# Patient Record
Sex: Male | Born: 1950 | Race: White | Hispanic: No
Health system: Southern US, Community
[De-identification: ages and names within clinical notes are randomized; demographics above are authoritative.]

---

## 2000-12-29 ENCOUNTER — Inpatient Hospital Stay (HOSPITAL_COMMUNITY): Admission: AD | Admit: 2000-12-29 | Discharge: 2000-12-30 | Payer: Self-pay | Admitting: Cardiovascular Disease

## 2003-03-21 ENCOUNTER — Emergency Department (HOSPITAL_COMMUNITY): Admission: EM | Admit: 2003-03-21 | Discharge: 2003-03-21 | Payer: Self-pay | Admitting: Emergency Medicine

## 2003-03-21 ENCOUNTER — Encounter: Payer: Self-pay | Admitting: Emergency Medicine

## 2003-03-22 ENCOUNTER — Encounter: Payer: Self-pay | Admitting: Urology

## 2003-03-22 ENCOUNTER — Ambulatory Visit (HOSPITAL_COMMUNITY): Admission: RE | Admit: 2003-03-22 | Discharge: 2003-03-22 | Payer: Self-pay | Admitting: Urology

## 2003-04-04 ENCOUNTER — Ambulatory Visit (HOSPITAL_COMMUNITY): Admission: RE | Admit: 2003-04-04 | Discharge: 2003-04-04 | Payer: Self-pay | Admitting: Urology

## 2003-04-04 ENCOUNTER — Encounter: Payer: Self-pay | Admitting: Urology

## 2003-04-05 ENCOUNTER — Ambulatory Visit (HOSPITAL_COMMUNITY): Admission: RE | Admit: 2003-04-05 | Discharge: 2003-04-05 | Payer: Self-pay | Admitting: Urology

## 2003-04-05 ENCOUNTER — Encounter: Payer: Self-pay | Admitting: Urology

## 2003-06-25 ENCOUNTER — Ambulatory Visit (HOSPITAL_COMMUNITY): Admission: RE | Admit: 2003-06-25 | Discharge: 2003-06-25 | Payer: Self-pay | Admitting: Urology

## 2003-06-25 ENCOUNTER — Encounter: Payer: Self-pay | Admitting: Urology

## 2003-09-03 ENCOUNTER — Ambulatory Visit (HOSPITAL_COMMUNITY): Admission: RE | Admit: 2003-09-03 | Discharge: 2003-09-03 | Payer: Self-pay | Admitting: Urology

## 2003-09-05 ENCOUNTER — Ambulatory Visit (HOSPITAL_COMMUNITY): Admission: RE | Admit: 2003-09-05 | Discharge: 2003-09-05 | Payer: Self-pay | Admitting: Urology

## 2003-09-06 ENCOUNTER — Ambulatory Visit (HOSPITAL_COMMUNITY): Admission: RE | Admit: 2003-09-06 | Discharge: 2003-09-06 | Payer: Self-pay | Admitting: Urology

## 2003-09-19 ENCOUNTER — Ambulatory Visit (HOSPITAL_COMMUNITY): Admission: RE | Admit: 2003-09-19 | Discharge: 2003-09-19 | Payer: Self-pay | Admitting: Urology

## 2003-09-24 ENCOUNTER — Ambulatory Visit (HOSPITAL_COMMUNITY): Admission: RE | Admit: 2003-09-24 | Discharge: 2003-09-24 | Payer: Self-pay | Admitting: Urology

## 2003-11-09 ENCOUNTER — Ambulatory Visit (HOSPITAL_COMMUNITY): Admission: RE | Admit: 2003-11-09 | Discharge: 2003-11-09 | Payer: Self-pay | Admitting: Urology

## 2004-04-14 ENCOUNTER — Ambulatory Visit (HOSPITAL_COMMUNITY): Admission: RE | Admit: 2004-04-14 | Discharge: 2004-04-14 | Payer: Self-pay | Admitting: *Deleted

## 2006-01-04 ENCOUNTER — Encounter: Admission: RE | Admit: 2006-01-04 | Discharge: 2006-01-04 | Payer: Self-pay | Admitting: Orthopedic Surgery

## 2006-01-05 ENCOUNTER — Encounter: Admission: RE | Admit: 2006-01-05 | Discharge: 2006-01-05 | Payer: Self-pay | Admitting: Orthopedic Surgery

## 2006-01-11 ENCOUNTER — Ambulatory Visit (HOSPITAL_COMMUNITY): Admission: RE | Admit: 2006-01-11 | Discharge: 2006-01-11 | Payer: Self-pay | Admitting: Urology

## 2006-01-22 ENCOUNTER — Encounter: Admission: RE | Admit: 2006-01-22 | Discharge: 2006-01-22 | Payer: Self-pay | Admitting: Orthopedic Surgery

## 2006-12-01 ENCOUNTER — Ambulatory Visit (HOSPITAL_COMMUNITY): Admission: RE | Admit: 2006-12-01 | Discharge: 2006-12-01 | Payer: Self-pay | Admitting: Urology

## 2007-06-03 ENCOUNTER — Ambulatory Visit (HOSPITAL_COMMUNITY): Admission: RE | Admit: 2007-06-03 | Discharge: 2007-06-03 | Payer: Self-pay | Admitting: Urology

## 2009-05-03 ENCOUNTER — Ambulatory Visit: Payer: Self-pay | Admitting: Surgery

## 2009-05-03 ENCOUNTER — Encounter (INDEPENDENT_AMBULATORY_CARE_PROVIDER_SITE_OTHER): Payer: Self-pay | Admitting: Emergency Medicine

## 2009-05-03 ENCOUNTER — Emergency Department (HOSPITAL_COMMUNITY): Admission: EM | Admit: 2009-05-03 | Discharge: 2009-05-03 | Payer: Self-pay | Admitting: Emergency Medicine

## 2009-09-24 ENCOUNTER — Ambulatory Visit: Payer: Self-pay | Admitting: Cardiothoracic Surgery

## 2009-09-24 ENCOUNTER — Inpatient Hospital Stay (HOSPITAL_COMMUNITY): Admission: EM | Admit: 2009-09-24 | Discharge: 2009-10-03 | Payer: Self-pay | Admitting: Emergency Medicine

## 2009-09-25 ENCOUNTER — Encounter: Payer: Self-pay | Admitting: Cardiothoracic Surgery

## 2009-10-21 ENCOUNTER — Encounter: Admission: RE | Admit: 2009-10-21 | Discharge: 2009-10-21 | Payer: Self-pay | Admitting: Cardiothoracic Surgery

## 2009-10-21 ENCOUNTER — Ambulatory Visit: Payer: Self-pay | Admitting: Cardiothoracic Surgery

## 2009-11-04 ENCOUNTER — Encounter (HOSPITAL_COMMUNITY): Admission: RE | Admit: 2009-11-04 | Discharge: 2010-01-23 | Payer: Self-pay | Admitting: Cardiology

## 2009-11-16 ENCOUNTER — Inpatient Hospital Stay (HOSPITAL_COMMUNITY): Admission: EM | Admit: 2009-11-16 | Discharge: 2009-11-20 | Payer: Self-pay | Admitting: Internal Medicine

## 2009-11-16 ENCOUNTER — Encounter (INDEPENDENT_AMBULATORY_CARE_PROVIDER_SITE_OTHER): Payer: Self-pay | Admitting: Internal Medicine

## 2009-11-16 ENCOUNTER — Ambulatory Visit: Payer: Self-pay | Admitting: Gastroenterology

## 2009-11-21 ENCOUNTER — Ambulatory Visit: Payer: Self-pay | Admitting: Gastroenterology

## 2009-11-21 ENCOUNTER — Telehealth: Payer: Self-pay | Admitting: Gastroenterology

## 2009-11-21 LAB — CONVERTED CEMR LAB
Eosinophils Relative: 4.7 % (ref 0.0–5.0)
HCT: 31.6 % — ABNORMAL LOW (ref 39.0–52.0)
Hemoglobin: 10.2 g/dL — ABNORMAL LOW (ref 13.0–17.0)
Lymphs Abs: 1.8 10*3/uL (ref 0.7–4.0)
MCV: 91.7 fL (ref 78.0–100.0)
Monocytes Relative: 11.6 % (ref 3.0–12.0)
Neutro Abs: 3.6 10*3/uL (ref 1.4–7.7)
WBC: 6.6 10*3/uL (ref 4.5–10.5)

## 2009-12-02 ENCOUNTER — Ambulatory Visit: Payer: Self-pay | Admitting: Gastroenterology

## 2009-12-02 DIAGNOSIS — I82409 Acute embolism and thrombosis of unspecified deep veins of unspecified lower extremity: Secondary | ICD-10-CM | POA: Insufficient documentation

## 2009-12-02 DIAGNOSIS — K253 Acute gastric ulcer without hemorrhage or perforation: Secondary | ICD-10-CM

## 2009-12-02 DIAGNOSIS — I2510 Atherosclerotic heart disease of native coronary artery without angina pectoris: Secondary | ICD-10-CM | POA: Insufficient documentation

## 2009-12-02 LAB — CONVERTED CEMR LAB
Basophils Absolute: 0.1 10*3/uL (ref 0.0–0.1)
Eosinophils Absolute: 0.2 10*3/uL (ref 0.0–0.7)
HCT: 30.1 % — ABNORMAL LOW (ref 39.0–52.0)
Lymphs Abs: 2.2 10*3/uL (ref 0.7–4.0)
MCHC: 34 g/dL (ref 30.0–36.0)
MCV: 94.1 fL (ref 78.0–100.0)
Monocytes Absolute: 0.8 10*3/uL (ref 0.1–1.0)
Platelets: 247 10*3/uL (ref 150.0–400.0)
RDW: 14.4 % (ref 11.5–14.6)

## 2010-01-22 ENCOUNTER — Ambulatory Visit: Payer: Self-pay | Admitting: Cardiothoracic Surgery

## 2010-07-12 IMAGING — CR DG CHEST 2V
1 series · 1 of 1 positions shown · non-contrast
Comparison: Chest radiograph performed 10/21/2009

CLINICAL DATA: Melena.

CHEST - 2 VIEW

[view not recorded]
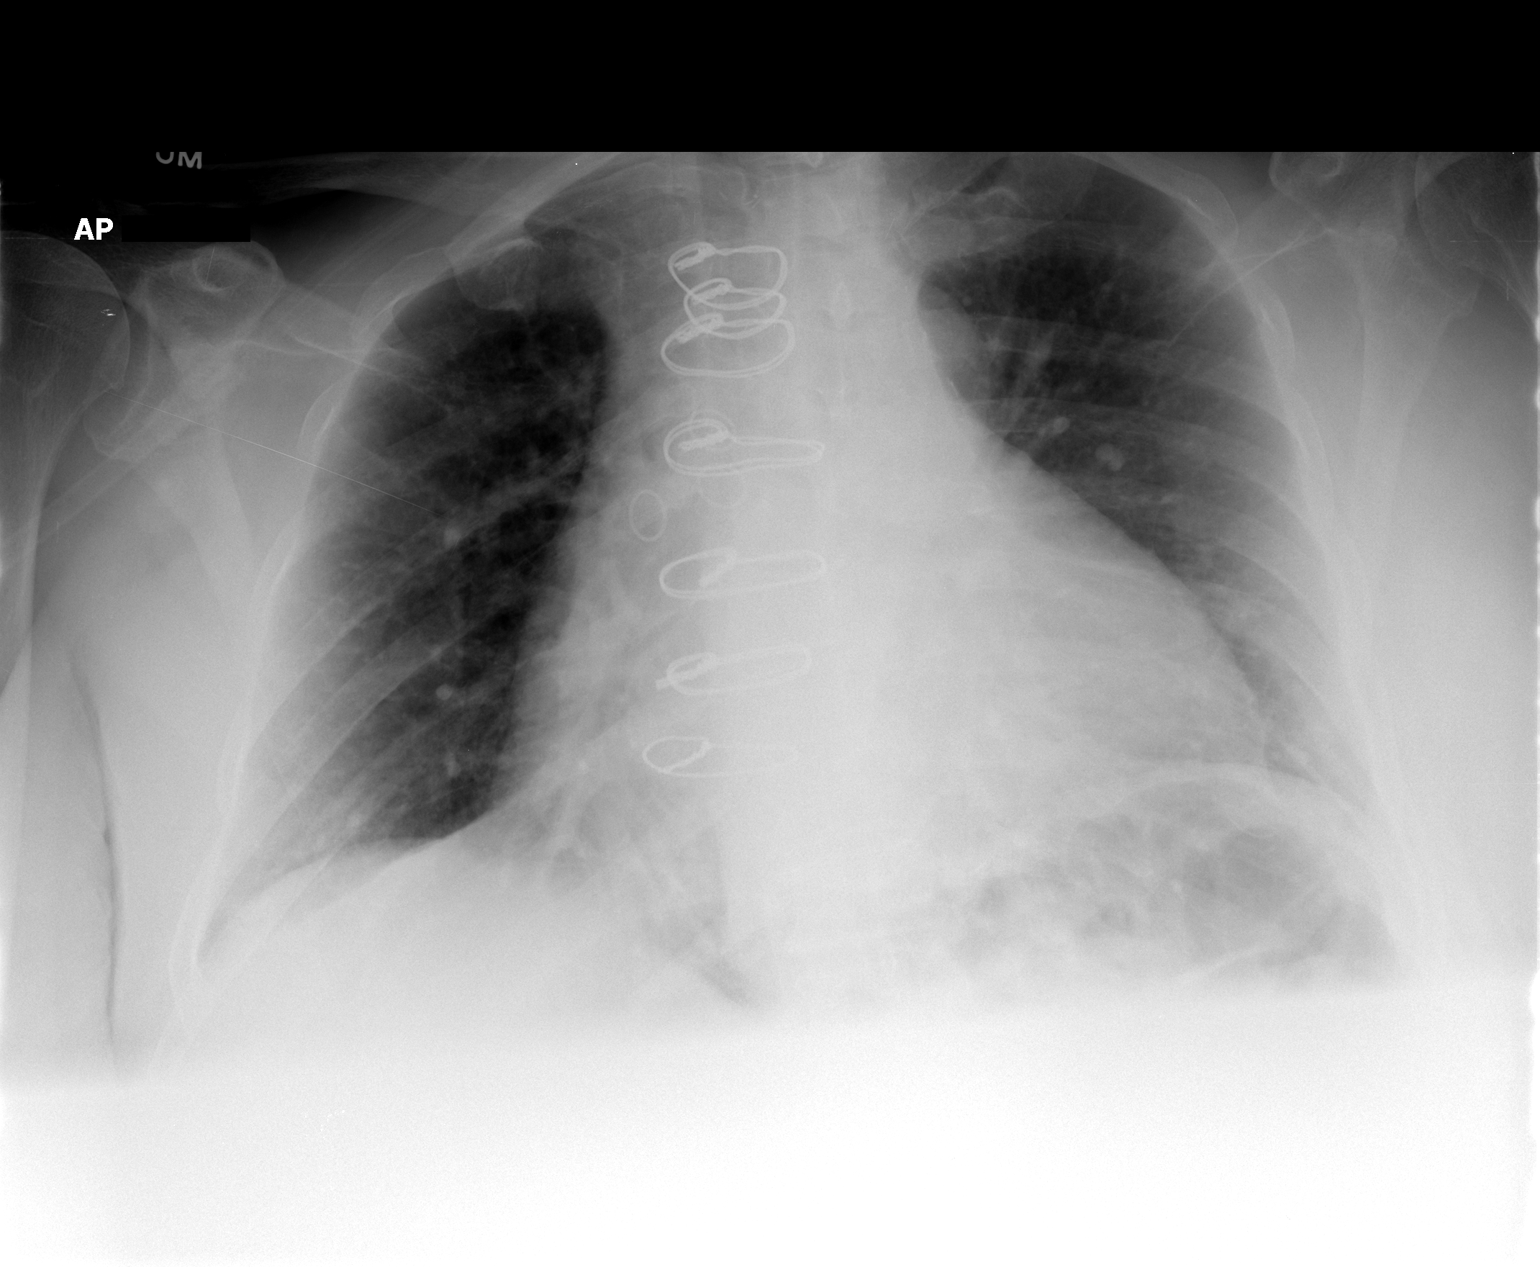

[1 of 1 positions shown; findings below may reference images not displayed]

FINDINGS: The lungs are mildly hypoexpanded, with mild left basilar
atelectasis.  Of the patient's small left pleural effusion appears
have largely resolved. There is no evidence of pneumothorax.

The heart is enlarged, more prominent than on the prior study; the
the patient is status post median sternotomy, with evidence of
prior CABG.  No acute osseous abnormalities are seen.
IMPRESSION: 1.  Mildly hypoexpanded lungs, with mild left basilar atelectasis.
Previously noted small left pleural effusion has largely resolved.
2.  Mild cardiomegaly noted, new from the prior study.

## 2010-11-25 NOTE — Letter (Signed)
Summary: New Patient letter  [facility]  [address] [place], [place] [address]   Phone: [phone]  Fax: [phone]       11/21/2009 MRN: [id number]  [facility] [address] [place], [place]  [address]  Dear Mr. Qadir,  Welcome to the Gastroenterology Division at [facility].    You are scheduled to see Dr. Melvia Heaps on 12-02-09 at 2:30pm, on the 3rd floor at [facility], [address].  We ask that you try to arrive at our office 15 minutes prior to your appointment time to allow for check-in.  We would like you to complete the enclosed self-administered evaluation form prior to your visit and bring it with you on the day of your appointment.  We will review it with you.  Also, please bring a complete list of all your medications or, if you prefer, bring the medication bottles and we will list them.  Please bring your insurance card so that we may make a copy of it.  If your insurance requires a referral to see a specialist, please bring your referral form from your primary care physician.  Co-payments are due at the time of your visit and may be paid by cash, check or credit card.     Your office visit will consist of a consult with your physician (includes a physical exam), any laboratory testing he/she may order, scheduling of any necessary diagnostic testing (e.g. x-ray, ultrasound, CT-scan), and scheduling of a procedure (e.g. Endoscopy, Colonoscopy) if required.  Please allow enough time on your schedule to allow for any/all of these possibilities.    If you cannot keep your appointment, please call [phone] to cancel or reschedule prior to your appointment date.  This allows [place] the opportunity to schedule an appointment for another patient in need of care.  If you do not cancel or reschedule by 5 p.m. the business day prior to your appointment date, you will be charged a $50.00 late cancellation/no-show fee.    Thank you for  choosing [facility] for your medical needs.  We appreciate the opportunity to care for you.  Please visit [place] at our website  to learn more about our practice.                     Sincerely,                                                             The Gastroenterology Division   Appended Document: New Patient letter Letter mailed to patient.

## 2010-11-25 NOTE — Procedures (Signed)
Summary: Upper Endoscopy  Patient: Gary [name] Note: All result statuses are Final unless otherwise noted.  Tests: (1) Upper Endoscopy (EGD)   EGD Upper Endoscopy       DONE     [facility]     [address]     [place], [place]  [address]           ENDOSCOPY PROCEDURE REPORT           PATIENT:  Gary [name], Gary [name]  MR#:  [id number]     BIRTHDATE:  05/19/1951, 58 yrs. old  GENDER:  male           ENDOSCOPIST:  Barbette Hair. Arlyce Dice, MD     Referred by:           PROCEDURE DATE:  11/16/2009     PROCEDURE:  EGD with biopsy     ASA CLASS:  Class II     INDICATIONS:  melena           MEDICATIONS:   Fentanyl 70 mcg IV, Versed 7 mg IV, glycopyrrolate     (Robinal) 0.2 mg IV     TOPICAL ANESTHETIC:  Cetacaine Spray           DESCRIPTION OF PROCEDURE:   After the risks benefits and     alternatives of the procedure were thoroughly explained, informed     consent was obtained.  The EG-[id number] ([id number]) endoscope was     introduced through the mouth and advanced to the third portion of     the duodenum, without limitations.  The instrument was slowly     withdrawn as the mucosa was fully examined.     <<PROCEDUREIMAGES>>           Multiple ulcers were found in the antrum. Multiple linear     superficial linear ulcers, some with red spots at base (see     image005 and image004). Bx taken  irregular Z-line at the     gastroesophageal junction (see image001). Bxs taken (h/o     Barrett's)  Otherwise the examination was normal.    Retroflexed     views revealed no abnormalities.    The scope was then withdrawn     from the patient and the procedure completed.           COMPLICATIONS:  None           ENDOSCOPIC IMPRESSION:     1) Ulcers, multiple in the antrum     2) Irregular Z-line at the gastroesophageal junction     3) Otherwise normal examination     RECOMMENDATIONS:     1) continue PPI - protonix BID     2) await biopsy results           REPEAT EXAM:  No       ______________________________     Barbette Hair. Arlyce Dice, MD           CC:           n.     eSIGNED:   Barbette Hair. Kaplan at 11/16/2009 04:35 PM           Gary [name], [id number]  Note: An exclamation mark (!) indicates a result that was not dispersed into the flowsheet. Document Creation Date: 11/16/2009 4:36 PM _______________________________________________________________________  (1) Order result status: Final Collection or observation date-time: 11/16/2009 16:32 Requested date-time:  Receipt date-time:  Reported date-time:  Referring Physician:   Ordering Physician: Melvia Heaps (  [id number]) Specimen Source:  Source: Launa Grill Order Number: [id number] Lab site:

## 2010-11-25 NOTE — Progress Notes (Signed)
Summary: TRIAGE-Black Stools  Phone Note Call from Patient Call back at Home Phone [phone]   Call For: Dr Arlyce Dice Reason for Call: Talk to Nurse Summary of Call: Had an Endo. on 11-16-09 and is having black stools today-wonders if this is normal. Initial call taken by: Leanor Kail [facility],  November 21, 2009 8:26 AM  Follow-up for Phone Call        Pt. was discharged from [facility] on 11-20-09, had an Endo. 11-16-09.  Pt. hasn't had a BM since Saturday, had a BM this morning, it was black and tarry. Denies BRB, pain, fever, n/v, fatigue, weakness.   [facility] PLEASE ADVISE  Follow-up by: Laureen Ochs LPN,  November 21, 2009 8:59 AM  Additional Follow-up for Phone Call Additional follow up Details #1::        Could be old blood.  Needs STAT CBC; keep NPO until we see results Additional Follow-up by: Louis Meckel MD,  November 21, 2009 9:19 AM    Additional Follow-up for Phone Call Additional follow up Details #2::    Above MD orders reviewed with patient. He will come to the lab now. Laureen Ochs LPN  November 21, 2009 9:28 AM   Per Dr.Kaplan-Blood count is stable, probably old blood, pt. notified. Pt. will f/u w/Dr.Kaplan on 12-02-09 at 2:30pm, he will get a CBCD prior. Pt. to callback right away for any continued black stools, n/v,fever,pain, etc.  Follow-up by: Laureen Ochs LPN,  November 21, 2009 11:30 AM

## 2010-11-25 NOTE — Assessment & Plan Note (Signed)
Summary: POST HOSPITAL/ULCERS               DEBORAH   History of Present Illness Visit Type: Follow-up Visit Primary GI MD: Melvia Heaps MD [facility] Primary Provider: Tonna Corner, MD  Requesting Provider: n/a Chief Complaint: F/u from hospital for Ulcers History of Present Illness:   Mr. Helmers has returned following hospitalization for an acute GI bleed secondary to gastric ulcer.  He was on aspirin and Plavix following an acute MI in December, 2010.  These medicines have been resumed.  He has no GI complaints.  Biopsies of both his ulcer and of an irregular GE junction demonstrated inflammatory changes only.   GI Review of Systems      Denies abdominal pain, acid reflux, belching, bloating, chest pain, dysphagia with liquids, dysphagia with solids, heartburn, loss of appetite, nausea, vomiting, vomiting blood, weight loss, and  weight gain.        Denies anal fissure, black tarry stools, change in bowel habit, constipation, diarrhea, diverticulosis, fecal incontinence, heme positive stool, hemorrhoids, irritable bowel syndrome, jaundice, light color stool, liver problems, rectal bleeding, and  rectal pain.    Current Medications (verified): 1)  Protonix 40 Mg Tbec (Pantoprazole Sodium) .Marland Kitchen.. 1 By Mouth Once Daily 2)  Plavix 75 Mg Tabs (Clopidogrel Bisulfate) .Marland Kitchen.. 1 By Mouth Once Daily 3)  Oxycodone Hcl 5 Mg Tabs (Oxycodone Hcl) .... As Needed 4)  Amiodarone Hcl 200 Mg Tabs (Amiodarone Hcl) .Marland Kitchen.. 1 By Mouth Once Daily 5)  Diazepam 5 Mg Tabs (Diazepam) .Marland Kitchen.. 1 By Mouth As Needed 6)  Folic Acid 1 Mg Tabs (Folic Acid) .Marland Kitchen.. 1 By Mouth Once Daily 7)  Metoprolol Tartrate 25 Mg Tabs (Metoprolol Tartrate) .Marland Kitchen.. 1 By Mouth Two Times A Day 8)  Potassium Citrate 10 Meq (1080 Mg) Cr-Tabs (Potassium Citrate) .Marland Kitchen.. 1 By Mouth Three Times A Day 9)  Ramipril 2.5 Mg Caps (Ramipril) .Marland Kitchen.. 1 By Mouth Once Daily 10)  Crestor 20 Mg Tabs (Rosuvastatin Calcium) .Marland Kitchen.. 1 By Mouth Once Daily 11)  Tramadol Hcl 50  Mg Tabs (Tramadol Hcl) .Marland Kitchen.. 1 By Mouth Three Times A Day As Needed 12)  Tylenol Arthritis Pain 650 Mg Cr-Tabs (Acetaminophen) .Marland Kitchen.. 1 By Mouth Once Daily As Needed  Allergies (verified): 1)  ! Sulfa  Past History:  Past Medical History: CAD Anemia Hypertension gastric antral  ulcer DVT Hepatitis B GERD Chronic Kidney Disease Barretts Esophagus Anxiety Disorder Arthritis  Past Surgical History: Heart Bypass  Anti-GERD surgery   Family History: Family History of Diabetes: Multiple Family Members Family History of Colon Cancer:Maternal Aunt and Maternal Uncle  Social History: Occupation: Tech Divorced 2 childern Patient is a former smoker. : quit 45 years ago  Alcohol Use - no Illicit Drug Use - no Smoking Status:  quit Drug Use:  no  Review of Systems       The patient complains of anxiety-new, arthritis/joint pain, depression-new, fatigue, night sweats, shortness of breath, and urination - excessive.  The patient denies allergy/sinus, anemia, back pain, blood in urine, breast changes/lumps, change in vision, confusion, cough, coughing up blood, fainting, fever, headaches-new, hearing problems, heart murmur, heart rhythm changes, itching, muscle pains/cramps, nosebleeds, skin rash, sleeping problems, sore throat, swelling of feet/legs, swollen lymph glands, thirst - excessive, urination changes/pain, urine leakage, vision changes, and voice change.    Vital Signs:  Patient profile:   60 year old male Height:      71 inches Weight:      228 pounds BMI:  31.91 BSA:     2.23 Pulse rate:   64 / minute Pulse rhythm:   regular BP sitting:   126 / 84  (left arm) Cuff size:   regular  Vitals Entered By: Ok Anis CMA (December 02, 2009 2:54 PM)   Impression & Recommendations:  Problem # 1:  ACUT GASTR ULCER W/O MENTION HEMORR/PERF W/OBST (ICD-531.31) Plan to continue Protonix for the next 3 months.  It is okay to continue aspirin and Plavix, in the absence of  overt  bleeding.  Problem # 2:  CAD (ICD-414.00) Assessment: Comment Only  Problem # 3:  Hx of DVT (ICD-453.40) Assessment: Comment Only  Patient Instructions: 1)  CC Dr. Warrick Parisian 2)  CC Dr. Julieanne Manson

## 2011-01-11 LAB — CBC
HCT: 24.3 % — ABNORMAL LOW (ref 39.0–52.0)
HCT: 24.9 % — ABNORMAL LOW (ref 39.0–52.0)
HCT: 24.9 % — ABNORMAL LOW (ref 39.0–52.0)
HCT: 27 % — ABNORMAL LOW (ref 39.0–52.0)
Hemoglobin: 7.6 g/dL — ABNORMAL LOW (ref 13.0–17.0)
Hemoglobin: 7.8 g/dL — ABNORMAL LOW (ref 13.0–17.0)
Hemoglobin: 8.4 g/dL — ABNORMAL LOW (ref 13.0–17.0)
Hemoglobin: 8.6 g/dL — ABNORMAL LOW (ref 13.0–17.0)
Hemoglobin: 9.4 g/dL — ABNORMAL LOW (ref 13.0–17.0)
MCHC: 33.8 g/dL (ref 30.0–36.0)
MCHC: 34.4 g/dL (ref 30.0–36.0)
MCHC: 34.4 g/dL (ref 30.0–36.0)
MCHC: 34.4 g/dL (ref 30.0–36.0)
MCHC: 34.8 g/dL (ref 30.0–36.0)
MCV: 89.9 fL (ref 78.0–100.0)
MCV: 90.3 fL (ref 78.0–100.0)
MCV: 91.6 fL (ref 78.0–100.0)
MCV: 92.6 fL (ref 78.0–100.0)
Platelets: 168 10*3/uL (ref 150–400)
Platelets: 177 10*3/uL (ref 150–400)
Platelets: 182 10*3/uL (ref 150–400)
Platelets: 185 10*3/uL (ref 150–400)
Platelets: 193 10*3/uL (ref 150–400)
Platelets: 195 10*3/uL (ref 150–400)
Platelets: 282 10*3/uL (ref 150–400)
RBC: 2.13 MIL/uL — ABNORMAL LOW (ref 4.22–5.81)
RBC: 2.68 MIL/uL — ABNORMAL LOW (ref 4.22–5.81)
RBC: 2.7 MIL/uL — ABNORMAL LOW (ref 4.22–5.81)
RBC: 2.95 MIL/uL — ABNORMAL LOW (ref 4.22–5.81)
RDW: 14.4 % (ref 11.5–15.5)
RDW: 14.6 % (ref 11.5–15.5)
RDW: 15 % (ref 11.5–15.5)
RDW: 15.1 % (ref 11.5–15.5)
RDW: 15.2 % (ref 11.5–15.5)
WBC: 10.2 10*3/uL (ref 4.0–10.5)
WBC: 15.3 10*3/uL — ABNORMAL HIGH (ref 4.0–10.5)
WBC: 5.8 10*3/uL (ref 4.0–10.5)
WBC: 7.4 10*3/uL (ref 4.0–10.5)
WBC: 7.4 10*3/uL (ref 4.0–10.5)
WBC: 8.4 10*3/uL (ref 4.0–10.5)
WBC: 8.9 10*3/uL (ref 4.0–10.5)

## 2011-01-11 LAB — CROSSMATCH
ABO/RH(D): B POS
Antibody Screen: POSITIVE
DAT, IgG: NEGATIVE
Donor AG Type: NEGATIVE
Donor AG Type: NEGATIVE
Donor AG Type: NEGATIVE

## 2011-01-11 LAB — COMPREHENSIVE METABOLIC PANEL
ALT: 16 U/L (ref 0–53)
AST: 17 U/L (ref 0–37)
AST: 23 U/L (ref 0–37)
BUN: 17 mg/dL (ref 6–23)
CO2: 23 mEq/L (ref 19–32)
CO2: 24 mEq/L (ref 19–32)
Calcium: 8.6 mg/dL (ref 8.4–10.5)
Chloride: 109 mEq/L (ref 96–112)
Chloride: 113 mEq/L — ABNORMAL HIGH (ref 96–112)
Creatinine, Ser: 0.93 mg/dL (ref 0.4–1.5)
GFR calc Af Amer: >60 mL/min (ref >60)
GFR calc non Af Amer: >60 mL/min (ref >60)
GFR calc non Af Amer: >60 mL/min (ref >60)
Glucose, Bld: 139 mg/dL — ABNORMAL HIGH (ref 70–99)
Sodium: 143 mEq/L (ref 135–145)
Total Bilirubin: 0.8 mg/dL (ref 0.3–1.2)

## 2011-01-11 LAB — BASIC METABOLIC PANEL
BUN: 12 mg/dL (ref 6–23)
BUN: 28 mg/dL — ABNORMAL HIGH (ref 6–23)
CO2: 24 mEq/L (ref 19–32)
GFR calc non Af Amer: >60 mL/min (ref >60)
Glucose, Bld: 90 mg/dL (ref 70–99)
Potassium: 3.7 mEq/L (ref 3.5–5.1)
Potassium: 3.9 mEq/L (ref 3.5–5.1)
Sodium: 141 mEq/L (ref 135–145)

## 2011-01-11 LAB — SAMPLE TO BLOOD BANK

## 2011-01-11 LAB — CULTURE, BLOOD (ROUTINE X 2)
Culture: NO GROWTH
Culture: NO GROWTH

## 2011-01-11 LAB — DIFFERENTIAL
Basophils Absolute: 0 10*3/uL (ref 0.0–0.1)
Basophils Absolute: 0 10*3/uL (ref 0.0–0.1)
Eosinophils Relative: 1 % (ref 0–5)
Lymphocytes Relative: 11 % — ABNORMAL LOW (ref 12–46)
Lymphocytes Relative: 20 % (ref 12–46)
Monocytes Absolute: 0.6 10*3/uL (ref 0.1–1.0)
Neutro Abs: 7.3 10*3/uL (ref 1.7–7.7)

## 2011-01-11 LAB — POCT I-STAT, CHEM 8
BUN: 78 mg/dL — ABNORMAL HIGH (ref 6–23)
Creatinine, Ser: 0.8 mg/dL (ref 0.4–1.5)
Hemoglobin: 7.8 g/dL — ABNORMAL LOW (ref 13.0–17.0)
Potassium: 4.9 mEq/L (ref 3.5–5.1)
Sodium: 142 mEq/L (ref 135–145)

## 2011-01-11 LAB — LIPID PANEL
Cholesterol: 63 mg/dL (ref 0–200)
HDL: 23 mg/dL — ABNORMAL LOW (ref >39)
LDL Cholesterol: 15 mg/dL (ref 0–99)

## 2011-01-11 LAB — CARDIAC PANEL(CRET KIN+CKTOT+MB+TROPI)
CK, MB: 2.6 ng/mL (ref 0.3–4.0)
Total CK: 139 U/L (ref 7–232)
Troponin I: 0.03 ng/mL (ref 0.00–0.06)

## 2011-01-11 LAB — HEMOCCULT GUIAC POC 1CARD (OFFICE): Fecal Occult Bld: POSITIVE

## 2011-01-11 LAB — CK TOTAL AND CKMB (NOT AT ARMC)
CK, MB: 3.4 ng/mL (ref 0.3–4.0)
Relative Index: 2.4 (ref 0.0–2.5)
Total CK: 144 U/L (ref 7–232)

## 2011-01-27 LAB — CBC
HCT: 23.9 % — ABNORMAL LOW (ref 39.0–52.0)
HCT: 24.7 % — ABNORMAL LOW (ref 39.0–52.0)
HCT: 24.8 % — ABNORMAL LOW (ref 39.0–52.0)
HCT: 25.5 % — ABNORMAL LOW (ref 39.0–52.0)
HCT: 27.3 % — ABNORMAL LOW (ref 39.0–52.0)
HCT: 38.6 % — ABNORMAL LOW (ref 39.0–52.0)
Hemoglobin: 10.7 g/dL — ABNORMAL LOW (ref 13.0–17.0)
Hemoglobin: 12.9 g/dL — ABNORMAL LOW (ref 13.0–17.0)
Hemoglobin: 8.1 g/dL — ABNORMAL LOW (ref 13.0–17.0)
Hemoglobin: 8.3 g/dL — ABNORMAL LOW (ref 13.0–17.0)
Hemoglobin: 8.4 g/dL — ABNORMAL LOW (ref 13.0–17.0)
Hemoglobin: 8.7 g/dL — ABNORMAL LOW (ref 13.0–17.0)
Hemoglobin: 9.4 g/dL — ABNORMAL LOW (ref 13.0–17.0)
MCHC: 33 g/dL (ref 30.0–36.0)
MCHC: 33.4 g/dL (ref 30.0–36.0)
MCHC: 33.7 g/dL (ref 30.0–36.0)
MCHC: 33.7 g/dL (ref 30.0–36.0)
MCHC: 34.1 g/dL (ref 30.0–36.0)
MCHC: 34.2 g/dL (ref 30.0–36.0)
MCHC: 34.3 g/dL (ref 30.0–36.0)
MCV: 88.8 fL (ref 78.0–100.0)
MCV: 88.9 fL (ref 78.0–100.0)
MCV: 89.3 fL (ref 78.0–100.0)
MCV: 91.5 fL (ref 78.0–100.0)
MCV: 91.7 fL (ref 78.0–100.0)
Platelets: 150 10*3/uL (ref 150–400)
Platelets: 152 10*3/uL (ref 150–400)
Platelets: 158 10*3/uL (ref 150–400)
Platelets: 175 10*3/uL (ref 150–400)
Platelets: 211 10*3/uL (ref 150–400)
Platelets: 239 10*3/uL (ref 150–400)
Platelets: 251 10*3/uL (ref 150–400)
RBC: 2.69 MIL/uL — ABNORMAL LOW (ref 4.22–5.81)
RBC: 2.69 MIL/uL — ABNORMAL LOW (ref 4.22–5.81)
RBC: 2.71 MIL/uL — ABNORMAL LOW (ref 4.22–5.81)
RBC: 2.76 MIL/uL — ABNORMAL LOW (ref 4.22–5.81)
RBC: 2.89 MIL/uL — ABNORMAL LOW (ref 4.22–5.81)
RBC: 3.06 MIL/uL — ABNORMAL LOW (ref 4.22–5.81)
RBC: 3.52 MIL/uL — ABNORMAL LOW (ref 4.22–5.81)
RDW: 13.1 % (ref 11.5–15.5)
RDW: 13.3 % (ref 11.5–15.5)
RDW: 13.6 % (ref 11.5–15.5)
RDW: 13.9 % (ref 11.5–15.5)
RDW: 13.9 % (ref 11.5–15.5)
RDW: 14 % (ref 11.5–15.5)
RDW: 14.2 % (ref 11.5–15.5)
WBC: 10.7 10*3/uL — ABNORMAL HIGH (ref 4.0–10.5)
WBC: 10.7 10*3/uL — ABNORMAL HIGH (ref 4.0–10.5)
WBC: 11.1 10*3/uL — ABNORMAL HIGH (ref 4.0–10.5)
WBC: 12.1 10*3/uL — ABNORMAL HIGH (ref 4.0–10.5)
WBC: 12.2 10*3/uL — ABNORMAL HIGH (ref 4.0–10.5)
WBC: 13.1 10*3/uL — ABNORMAL HIGH (ref 4.0–10.5)
WBC: 14.6 10*3/uL — ABNORMAL HIGH (ref 4.0–10.5)

## 2011-01-27 LAB — POCT I-STAT 4, (NA,K, GLUC, HGB,HCT)
Glucose, Bld: 113 mg/dL — ABNORMAL HIGH (ref 70–99)
Glucose, Bld: 119 mg/dL — ABNORMAL HIGH (ref 70–99)
Glucose, Bld: 122 mg/dL — ABNORMAL HIGH (ref 70–99)
Glucose, Bld: 133 mg/dL — ABNORMAL HIGH (ref 70–99)
Glucose, Bld: 140 mg/dL — ABNORMAL HIGH (ref 70–99)
HCT: 26 % — ABNORMAL LOW (ref 39.0–52.0)
HCT: 26 % — ABNORMAL LOW (ref 39.0–52.0)
HCT: 27 % — ABNORMAL LOW (ref 39.0–52.0)
HCT: 28 % — ABNORMAL LOW (ref 39.0–52.0)
HCT: 29 % — ABNORMAL LOW (ref 39.0–52.0)
Hemoglobin: 8.5 g/dL — ABNORMAL LOW (ref 13.0–17.0)
Hemoglobin: 8.8 g/dL — ABNORMAL LOW (ref 13.0–17.0)
Hemoglobin: 8.8 g/dL — ABNORMAL LOW (ref 13.0–17.0)
Hemoglobin: 8.8 g/dL — ABNORMAL LOW (ref 13.0–17.0)
Hemoglobin: 9.9 g/dL — ABNORMAL LOW (ref 13.0–17.0)
Potassium: 3.7 mEq/L (ref 3.5–5.1)
Potassium: 3.8 mEq/L (ref 3.5–5.1)
Potassium: 4.4 mEq/L (ref 3.5–5.1)
Potassium: 4.5 mEq/L (ref 3.5–5.1)
Potassium: 5.1 mEq/L (ref 3.5–5.1)
Sodium: 131 mEq/L — ABNORMAL LOW (ref 135–145)
Sodium: 132 mEq/L — ABNORMAL LOW (ref 135–145)
Sodium: 134 mEq/L — ABNORMAL LOW (ref 135–145)
Sodium: 137 mEq/L (ref 135–145)
Sodium: 139 mEq/L (ref 135–145)
Sodium: 139 mEq/L (ref 135–145)
Sodium: 162 mEq/L (ref 135–145)

## 2011-01-27 LAB — BASIC METABOLIC PANEL
BUN: 10 mg/dL (ref 6–23)
BUN: 14 mg/dL (ref 6–23)
BUN: 19 mg/dL (ref 6–23)
BUN: 25 mg/dL — ABNORMAL HIGH (ref 6–23)
BUN: 31 mg/dL — ABNORMAL HIGH (ref 6–23)
CO2: 26 mEq/L (ref 19–32)
CO2: 28 mEq/L (ref 19–32)
CO2: 30 mEq/L (ref 19–32)
CO2: 30 mEq/L (ref 19–32)
CO2: 32 mEq/L (ref 19–32)
Calcium: 7.8 mg/dL — ABNORMAL LOW (ref 8.4–10.5)
Calcium: 8.1 mg/dL — ABNORMAL LOW (ref 8.4–10.5)
Calcium: 8.2 mg/dL — ABNORMAL LOW (ref 8.4–10.5)
Calcium: 8.3 mg/dL — ABNORMAL LOW (ref 8.4–10.5)
Calcium: 8.3 mg/dL — ABNORMAL LOW (ref 8.4–10.5)
Calcium: 8.5 mg/dL (ref 8.4–10.5)
Calcium: 8.6 mg/dL (ref 8.4–10.5)
Calcium: 8.6 mg/dL (ref 8.4–10.5)
Chloride: 101 mEq/L (ref 96–112)
Chloride: 104 mEq/L (ref 96–112)
Chloride: 105 mEq/L (ref 96–112)
Chloride: 106 mEq/L (ref 96–112)
Chloride: 108 mEq/L (ref 96–112)
Creatinine, Ser: 0.86 mg/dL (ref 0.4–1.5)
Creatinine, Ser: 1.05 mg/dL (ref 0.4–1.5)
Creatinine, Ser: 1.15 mg/dL (ref 0.4–1.5)
Creatinine, Ser: 1.31 mg/dL (ref 0.4–1.5)
Creatinine, Ser: 1.47 mg/dL (ref 0.4–1.5)
Creatinine, Ser: 1.69 mg/dL — ABNORMAL HIGH (ref 0.4–1.5)
GFR calc Af Amer: 51 mL/min — ABNORMAL LOW (ref >60)
GFR calc Af Amer: 60 mL/min — ABNORMAL LOW (ref >60)
GFR calc Af Amer: >60 mL/min (ref >60)
GFR calc Af Amer: >60 mL/min (ref >60)
GFR calc Af Amer: >60 mL/min (ref >60)
GFR calc Af Amer: >60 mL/min (ref >60)
GFR calc Af Amer: >60 mL/min (ref >60)
GFR calc non Af Amer: 42 mL/min — ABNORMAL LOW (ref >60)
GFR calc non Af Amer: >60 mL/min (ref >60)
GFR calc non Af Amer: >60 mL/min (ref >60)
GFR calc non Af Amer: >60 mL/min (ref >60)
GFR calc non Af Amer: >60 mL/min (ref >60)
GFR calc non Af Amer: >60 mL/min (ref >60)
Glucose, Bld: 128 mg/dL — ABNORMAL HIGH (ref 70–99)
Glucose, Bld: 128 mg/dL — ABNORMAL HIGH (ref 70–99)
Glucose, Bld: 129 mg/dL — ABNORMAL HIGH (ref 70–99)
Glucose, Bld: 142 mg/dL — ABNORMAL HIGH (ref 70–99)
Glucose, Bld: 143 mg/dL — ABNORMAL HIGH (ref 70–99)
Potassium: 4.3 mEq/L (ref 3.5–5.1)
Potassium: 4.5 mEq/L (ref 3.5–5.1)
Potassium: 4.8 mEq/L (ref 3.5–5.1)
Sodium: 137 mEq/L (ref 135–145)
Sodium: 139 mEq/L (ref 135–145)
Sodium: 140 mEq/L (ref 135–145)
Sodium: 140 mEq/L (ref 135–145)
Sodium: 144 mEq/L (ref 135–145)

## 2011-01-27 LAB — POCT I-STAT 3, ART BLOOD GAS (G3+)
Acid-Base Excess: 1 mmol/L (ref 0.0–2.0)
Acid-Base Excess: 2 mmol/L (ref 0.0–2.0)
Bicarbonate: 25 mEq/L — ABNORMAL HIGH (ref 20.0–24.0)
Bicarbonate: 26.8 mEq/L — ABNORMAL HIGH (ref 20.0–24.0)
O2 Saturation: 94 %
O2 Saturation: 98 %
Patient temperature: 36.8
TCO2: 26 mmol/L (ref 0–100)
TCO2: 28 mmol/L (ref 0–100)
TCO2: 28 mmol/L (ref 0–100)
pCO2 arterial: 43.2 mmHg (ref 35.0–45.0)
pCO2 arterial: 45.4 mmHg — ABNORMAL HIGH (ref 35.0–45.0)
pCO2 arterial: 49.5 mmHg — ABNORMAL HIGH (ref 35.0–45.0)
pH, Arterial: 7.335 — ABNORMAL LOW (ref 7.350–7.450)
pH, Arterial: 7.401 (ref 7.350–7.450)
pO2, Arterial: 104 mmHg — ABNORMAL HIGH (ref 80.0–100.0)
pO2, Arterial: 188 mmHg — ABNORMAL HIGH (ref 80.0–100.0)

## 2011-01-27 LAB — URINALYSIS, ROUTINE W REFLEX MICROSCOPIC
Bilirubin Urine: NEGATIVE
Glucose, UA: NEGATIVE mg/dL
Hgb urine dipstick: NEGATIVE
Ketones, ur: NEGATIVE mg/dL
Nitrite: NEGATIVE
Protein, ur: NEGATIVE mg/dL
Specific Gravity, Urine: 1.03 (ref 1.005–1.030)
Urobilinogen, UA: 0.2 mg/dL (ref 0.0–1.0)
pH: 5.5 (ref 5.0–8.0)

## 2011-01-27 LAB — CK TOTAL AND CKMB (NOT AT ARMC)
CK, MB: 10.7 ng/mL — ABNORMAL HIGH (ref 0.3–4.0)
CK, MB: 131 ng/mL — ABNORMAL HIGH (ref 0.3–4.0)
CK, MB: 61.1 ng/mL — ABNORMAL HIGH (ref 0.3–4.0)
CK, MB: 83.7 ng/mL — ABNORMAL HIGH (ref 0.3–4.0)
Relative Index: 11.3 — ABNORMAL HIGH (ref 0.0–2.5)
Relative Index: 12 — ABNORMAL HIGH (ref 0.0–2.5)
Relative Index: 14.3 — ABNORMAL HIGH (ref 0.0–2.5)
Relative Index: 2.9 — ABNORMAL HIGH (ref 0.0–2.5)
Total CK: 1000 U/L — ABNORMAL HIGH (ref 7–232)
Total CK: 1047 U/L — ABNORMAL HIGH (ref 7–232)
Total CK: 426 U/L — ABNORMAL HIGH (ref 7–232)
Total CK: 697 U/L — ABNORMAL HIGH (ref 7–232)
Total CK: 730 U/L — ABNORMAL HIGH (ref 7–232)
Total CK: 917 U/L — ABNORMAL HIGH (ref 7–232)

## 2011-01-27 LAB — CARDIAC PANEL(CRET KIN+CKTOT+MB+TROPI)
CK, MB: 72.9 ng/mL — ABNORMAL HIGH (ref 0.3–4.0)
Relative Index: 12.2 — ABNORMAL HIGH (ref 0.0–2.5)
Total CK: 596 U/L — ABNORMAL HIGH (ref 7–232)
Troponin I: 15.71 ng/mL (ref 0.00–0.06)

## 2011-01-27 LAB — TROPONIN I
Troponin I: 13.11 ng/mL (ref 0.00–0.06)
Troponin I: 15.79 ng/mL (ref 0.00–0.06)
Troponin I: 19.69 ng/mL (ref 0.00–0.06)
Troponin I: 7 ng/mL (ref 0.00–0.06)
Troponin I: 7.09 ng/mL (ref 0.00–0.06)

## 2011-01-27 LAB — HEMOGLOBIN AND HEMATOCRIT, BLOOD
HCT: 25.1 % — ABNORMAL LOW (ref 39.0–52.0)
HCT: 25.8 % — ABNORMAL LOW (ref 39.0–52.0)
HCT: 32.1 % — ABNORMAL LOW (ref 39.0–52.0)
HCT: 34.2 % — ABNORMAL LOW (ref 39.0–52.0)
Hemoglobin: 11.7 g/dL — ABNORMAL LOW (ref 13.0–17.0)

## 2011-01-27 LAB — POCT I-STAT, CHEM 8
BUN: 16 mg/dL (ref 6–23)
Chloride: 102 mEq/L (ref 96–112)
Creatinine, Ser: 1.1 mg/dL (ref 0.4–1.5)
Potassium: 4.2 mEq/L (ref 3.5–5.1)
Sodium: 141 mEq/L (ref 135–145)

## 2011-01-27 LAB — MAGNESIUM
Magnesium: 2.4 mg/dL (ref 1.5–2.5)
Magnesium: 2.5 mg/dL (ref 1.5–2.5)

## 2011-01-27 LAB — POCT I-STAT 3, VENOUS BLOOD GAS (G3P V)
Bicarbonate: 25.4 mEq/L — ABNORMAL HIGH (ref 20.0–24.0)
O2 Saturation: 81 %
TCO2: 27 mmol/L (ref 0–100)
pCO2, Ven: 49.5 mmHg (ref 45.0–50.0)
pH, Ven: 7.319 — ABNORMAL HIGH (ref 7.250–7.300)
pO2, Ven: 49 mmHg — ABNORMAL HIGH (ref 30.0–45.0)

## 2011-01-27 LAB — BLOOD GAS, ARTERIAL
Acid-base deficit: 0.6 mmol/L (ref 0.0–2.0)
Bicarbonate: 23.4 mEq/L (ref 20.0–24.0)
FIO2: 0.21 %
O2 Saturation: 95.2 %
Patient temperature: 98.6
TCO2: 24.5 mmol/L (ref 0–100)
pCO2 arterial: 37.4 mmHg (ref 35.0–45.0)
pH, Arterial: 7.413 (ref 7.350–7.450)
pO2, Arterial: 80.6 mmHg (ref 80.0–100.0)

## 2011-01-27 LAB — PREPARE PLATELETS

## 2011-01-27 LAB — PREPARE FRESH FROZEN PLASMA

## 2011-01-27 LAB — CREATININE, SERUM
Creatinine, Ser: 1.06 mg/dL (ref 0.4–1.5)
GFR calc Af Amer: >60 mL/min (ref >60)
GFR calc non Af Amer: >60 mL/min (ref >60)

## 2011-01-27 LAB — GLUCOSE, CAPILLARY
Glucose-Capillary: 106 mg/dL — ABNORMAL HIGH (ref 70–99)
Glucose-Capillary: 114 mg/dL — ABNORMAL HIGH (ref 70–99)
Glucose-Capillary: 119 mg/dL — ABNORMAL HIGH (ref 70–99)
Glucose-Capillary: 141 mg/dL — ABNORMAL HIGH (ref 70–99)
Glucose-Capillary: 71 mg/dL (ref 70–99)

## 2011-01-27 LAB — PLATELET COUNT: Platelets: 144 10*3/uL — ABNORMAL LOW (ref 150–400)

## 2011-01-27 LAB — PROTIME-INR
INR: 1.14 (ref 0.00–1.49)
INR: 1.45 (ref 0.00–1.49)
Prothrombin Time: 14.5 seconds (ref 11.6–15.2)
Prothrombin Time: 17.5 seconds — ABNORMAL HIGH (ref 11.6–15.2)

## 2011-01-27 LAB — APTT: aPTT: 35 seconds (ref 24–37)

## 2011-01-28 LAB — HEPATIC FUNCTION PANEL
Albumin: 3.3 g/dL — ABNORMAL LOW (ref 3.5–5.2)
Alkaline Phosphatase: 67 U/L (ref 39–117)
Indirect Bilirubin: 1.1 mg/dL — ABNORMAL HIGH (ref 0.3–0.9)
Total Protein: 6.1 g/dL (ref 6.0–8.3)

## 2011-01-28 LAB — CROSSMATCH
ABO/RH(D): B POS
Antibody Screen: POSITIVE
DAT, IgG: NEGATIVE
Donor AG Type: NEGATIVE
Donor AG Type: NEGATIVE
Donor AG Type: NEGATIVE
Donor AG Type: NEGATIVE
PT AG Type: NEGATIVE

## 2011-01-28 LAB — CBC
HCT: 47.1 % (ref 39.0–52.0)
Hemoglobin: 15.6 g/dL (ref 13.0–17.0)
MCHC: 33.2 g/dL (ref 30.0–36.0)
RBC: 5.26 MIL/uL (ref 4.22–5.81)
RDW: 13.9 % (ref 11.5–15.5)

## 2011-01-28 LAB — DIFFERENTIAL
Basophils Absolute: 0 10*3/uL (ref 0.0–0.1)
Basophils Relative: 0 % (ref 0–1)
Eosinophils Relative: 1 % (ref 0–5)
Lymphocytes Relative: 24 % (ref 12–46)
Monocytes Absolute: 1 10*3/uL (ref 0.1–1.0)
Monocytes Relative: 9 % (ref 3–12)

## 2011-01-28 LAB — HEPARIN LEVEL (UNFRACTIONATED): Heparin Unfractionated: <0.1 IU/mL — ABNORMAL LOW (ref 0.30–0.70)

## 2011-01-28 LAB — POCT CARDIAC MARKERS
CKMB, poc: 77.3 ng/mL (ref 1.0–8.0)
Myoglobin, poc: >500 ng/mL (ref 12–200)
Troponin i, poc: 0.9 ng/mL (ref 0.00–0.09)

## 2011-01-28 LAB — COMPREHENSIVE METABOLIC PANEL
Alkaline Phosphatase: 70 U/L (ref 39–117)
BUN: 14 mg/dL (ref 6–23)
CO2: 25 mEq/L (ref 19–32)
Calcium: 8.8 mg/dL (ref 8.4–10.5)
GFR calc non Af Amer: >60 mL/min (ref >60)
Glucose, Bld: 87 mg/dL (ref 70–99)
Potassium: 4.1 mEq/L (ref 3.5–5.1)
Total Protein: 6.5 g/dL (ref 6.0–8.3)

## 2011-01-28 LAB — POCT I-STAT, CHEM 8
BUN: 19 mg/dL (ref 6–23)
Calcium, Ion: 1.17 mmol/L (ref 1.12–1.32)
Glucose, Bld: 89 mg/dL (ref 70–99)
TCO2: 27 mmol/L (ref 0–100)

## 2011-01-28 LAB — CK TOTAL AND CKMB (NOT AT ARMC)
CK, MB: 136.7 ng/mL — ABNORMAL HIGH (ref 0.3–4.0)
Relative Index: 17.9 — ABNORMAL HIGH (ref 0.0–2.5)
Total CK: 763 U/L — ABNORMAL HIGH (ref 7–232)

## 2011-01-28 LAB — HEMOGLOBIN A1C: Hgb A1c MFr Bld: 7 % — ABNORMAL HIGH (ref 4.6–6.1)

## 2011-01-28 LAB — POCT I-STAT 3, ART BLOOD GAS (G3+)
pCO2 arterial: 39 mmHg (ref 35.0–45.0)
pH, Arterial: 7.389 (ref 7.350–7.450)
pO2, Arterial: 74 mmHg — ABNORMAL LOW (ref 80.0–100.0)

## 2011-01-28 LAB — TSH: TSH: 2.794 u[IU]/mL (ref 0.350–4.500)

## 2011-01-28 LAB — MAGNESIUM: Magnesium: 2 mg/dL (ref 1.5–2.5)

## 2011-01-28 LAB — TROPONIN I: Troponin I: 4.58 ng/mL (ref 0.00–0.06)

## 2011-02-01 LAB — POCT I-STAT, CHEM 8
Calcium, Ion: 1.14 mmol/L (ref 1.12–1.32)
Chloride: 107 mEq/L (ref 96–112)
Glucose, Bld: 95 mg/dL (ref 70–99)
HCT: 51 % (ref 39.0–52.0)
TCO2: 26 mmol/L (ref 0–100)

## 2011-02-01 LAB — PROTIME-INR
INR: 0.9 (ref 0.00–1.49)
Prothrombin Time: 12.7 seconds (ref 11.6–15.2)

## 2011-02-01 LAB — APTT: aPTT: 37 seconds (ref 24–37)

## 2011-03-10 NOTE — Assessment & Plan Note (Signed)
OFFICE VISIT   Gary, [name]  DOB:  April 12, 1951                                        October 21, 2009  CHART #:  [id number]   HISTORY OF PRESENT ILLNESS:  The patient comes in today for a 3-week  postoperative follow up.  He is status post coronary artery bypass  grafting on September 26, 2009.  He has some postoperative atrial  fibrillation which converted to normal sinus rhythm on amiodarone.  He  also had some hypertension postoperatively and was started on Altace and  Lopressor.  At the time of discharge home on October 03, 2009, his blood  pressure was well controlled.  He is maintaining normal sinus rhythm.  He was otherwise doing well.  Since his discharge home, he has continued  to make progress.  He states that he has completely quit smoking and has  no desire to restart.  He has also been carefully watching his diet and  has lost a few pounds.  He is gradually increasing his exercise  tolerance.  He is scheduled to begin cardiac rehab on October 31, 2008.  He also has an appointment to see Dr. Clarene [name] on October 28, 2008.  He is  taking the pain medication minimally at this point.  He is really not  having any discomfort and is having no shortness of breath.  His lower  extremity edema has also resolved.  Overall, he is progressing well  without complaints.   PHYSICAL EXAMINATION:  Vital Signs:  Blood pressure is 112/73, pulse is  78, respirations 16, O2 sat 98% on room air.  His sternal and left lower  extremity incisions have healed well.  His sternum is stable to  palpation.  Heart:  Regular rate and rhythm without murmurs, rubs, or  gallops.  Lungs:  Clear.  Extremities:  No edema.   DIAGNOSTIC TESTS:  Chest x-ray shows almost complete resolution of  pleural effusions and otherwise is stable.   ASSESSMENT AND PLAN:  The patient is doing well status post coronary  artery bypass graft.  At this point, I feel that he may be released to  return on a p.r.n. basis.  He is scheduled to follow up as previously  dictated with Dr. Clarene [name].  I have also encouraged him to proceed with  cardiac  rehab.  At this point, he also may begin driving and increasing his  daily activities.  He may call us if he has any problems or any other  questions in the interim.   Kerin Perna, M.D.  Electronically Signed   GC/MEDQ  D:  10/21/2009  T:  10/22/2009  Job:  [id number]   cc:   Gary [name], M.D.  Gary [name], M.D.

## 2011-03-10 NOTE — Assessment & Plan Note (Signed)
OFFICE VISIT   Gary [name], Gary [name]  DOB:  21-Mar-1951                                        January 22, 2010  CHART #:  [id number]   CURRENT PROBLEMS:  1. 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  2. Upper gastrointestinal bleed requiring transfusion, January 2011,      related to gastritis and Plavix and aspirin.  3. History of smoking, now reformed.   PRESENT ILLNESS:  The patient returns for a 37-month review following his  urgent multivessel bypass grafting in early December 2010.  At that  time, he presented with an acute inferior MI and a cardiac cath  demonstrating multivessel disease.  The right coronary was stented with  a bare-metal stent by Dr. Clarene Duke and he underwent mammary bypass graft  to the LAD and vein grafts to the diagonal, obtuse marginal, and distal  posterolateral branch of the circumflex, a codominant vessel.  The  patient has done well since surgery and is doing heavy yard work without  angina.  The surgical incisions are healing well in the chest and leg.  He has had no recurrence of GI bleeding after stopping his aspirin and  taking Protonix and he continues on the Plavix under the direction of  Dr. Clarene Duke.  He is concerned about physical activity limits that could  preclude him finding a new job.   PHYSICAL EXAMINATION:  Blood pressure 130/80, pulse 60, respirations 18,  saturation 97%, weight 218 pounds.  He is alert and pleasant.  Breath  sounds are clear and equal.  The sternum is stable and well healed.  Cardiac rhythm is regular without S3, gallop, or murmur.  The leg  incision is well healed, and there is no pedal edema.  Neurologic exam  is intact.   IMPRESSION:  The patient has done well now 4 months after multivessel  bypass grafting.  He is doing heavy physical work and he should be under  no lifting restrictions  at this time.  He was  provided a note he had showed to a potential employer that he has no  physical activity limitations from his heart surgery in early December  2010.   Kerin Perna, M.D.  Electronically Signed   PV/MEDQ  D:  01/22/2010  T:  01/23/2010  Job:  [id number]   cc:   Thereasa Solo. Little, M.D.  [employer].

## 2011-03-13 NOTE — Discharge Summary (Signed)
[facility]  Patient:    Gary [name], Gary [name]                       MRN: [id number] Adm. Date:  [id number] Disc. Date: [id number] Attending:  Virgina Evener Dictator:   Delice Bison Jernejcic, P.A.                           Discharge Summary  DATE OF BIRTH:  25-Jun-1951  ADMISSION DIAGNOSES: 1. Chest pain, rule out myocardial infarction. 2. Unknown lipid status. 3. Hepatitis B. 4. History of heavy tobacco abuse. 5. History of alcoholism with Barretts esophagus. 6. Status post partial gastrectomy due to peptic ulcer disease remotely.  DISCHARGE DIAGNOSES: 1. Chest pain, resolved, myocardial infarction ruled out with negative    enzymes.  Cardiac catheterization revealing noncritical coronary artery    disease, suspect gastrointestinal etiology of chest pain. 2. Unknown lipid status. 3. Hepatitis B. 4. History of heavy tobacco abuse. 5. History of alcoholism with Barretts esophagus. 6. Status post partial gastrectomy due to peptic ulcer disease remotely.  HISTORY OF PRESENT ILLNESS:  Gary [name] is a 60 year old divorced white male new patient to [facility] who was transferred from [facility] today to evaluate chest pain.  Last night while driving, he developed onset of substernal chest pressure, abdominal discomfort, and irregular tachypalpitations.  These eventually eased off.  He woke up around 3:30 a.m. with substernal chest pressure from sleep.  Positive diaphoresis and shortness of breath and discomfort down the left forearm.  He went to [facility] emergency room and was treated with O2 nasal cannula, sublingual nitroglycerin, and baby aspirin with complete relief of his symptoms.  EKG was not acute and first enzymes were negative. He states he never felt this way before.  Denies PND, orthopnea, presyncope, or syncope, hypertension.  He will be admitted for chest pain to rule out  MI and will plan cardiac catheterization to define coronary anatomy.  PROCEDURE:  Cardiac catheterization by Lennette Bihari, M.D. on December 29, 2000.  COMPLICATIONS:  None.  CONSULTING PHYSICIANS:  None.  HOSPITAL COURSE:  Gary [name] was taken directly to the catheterization lab as a transfer from [facility] for chest pain.  Precath laboratory studies were within normal limits; potassium 4.4, BUN 18, creatinine 1.1, INR 1.1, hemoglobin 15, platelets 153.  LFTs were within normal limits.  Cardiac enzymes negative.  Dr. Tresa Endo proceeded with cardiac catheterization on December 29, 2000.  This revealed a normal left main, normal LAD with a 30 to 40% ostial diagonal 2 lesion, circumflex had a 30% lesion in its midportion.  OM1 had a 30% lesion in its midportion.  RCA was nondominant and free of disease.  EF normal.  The patient tolerated the procedure well.  Dr. Tresa Endo recommended medical therapy of blood pressure and to evaluate lipid status.  He recommended proton pump inhibitor therapy as this was likely GI and not ischemic pain.  The patient remained stable throughout the remainder of his hospital stay. He had some vague discomfort of his neck which was throbbing in sensation, but no further chest pain.  Total cholesterol 150, triglycerides 132, HDL 26, and LDL 98.  The patient was felt stable for discharge to home on December 30, 2000.  DISCHARGE MEDICATIONS: 1. Aspirin 325 mg a day. 2. Protonix 40 mg a day. 3. Toprol 25 mg  b.i.d. 4. Altace 2.5 mg a day. 5. Niospan 500 mg one p.o. q.h.s. to be taken with a low fat snack. He has    been warned of the side effects of hot flashes.  No strenuous activity, lifting more than 5 pounds, or driving for the next three days.  Low fat, low cholesterol, low salt diet.  He is asked to call the office for any problems or questions.  FOLLOW-UP:  Follow-up appointment has been scheduled with Lennette Bihari, M.D. in [place],  [place], on March 28, at 11 a.m. DD:  12/30/00 TD:  12/31/00 Job: [id number]

## 2011-03-13 NOTE — H&P (Signed)
Gary [name], Gary [name]                          ACCOUNT NO.:  [id number]   MEDICAL RECORD NO.:  [id number]                   PATIENT TYPE:   LOCATION:                                       FACILITY:  [facility]   PHYSICIAN:  Dennie Maizes, M.D.                DATE OF BIRTH:  23-Sep-1951   DATE OF ADMISSION:  DATE OF DISCHARGE:                                HISTORY & PHYSICAL   CHIEF COMPLAINT:  Severe right flank pain radiating to the flank, nausea,  right upper ureteral calculus with obstruction.   HISTORY OF PRESENT ILLNESS:  This 60 year old male has a past history of  recurrent ureterolithiasis.  He underwent ureteroscopy and stone extraction  in 1995/03/08.  He has passed several stones since that time.  He experienced  sudden onset of severe right flank pain radiating to the flank since Mar 19, 2003.  This was associated with nausea and vomiting.  The patient was  evaluated in the emergency room at [facility].  A non-contrast  spiral CT scan of the abdomen and pelvis revealed a 6-mm-sized right upper  ureteral calculus with obstruction and hydronephrosis.  Bilateral small  renal calculi were also noted.  The patient is unable to pass the stone and  he had severe and persistent pain.  Cystoscopy, retrograde pyelogram and  right ureteral stent placement were done on Mar 22, 2003.  The patient is  brought to the day hospital today for ESWL of the right upper ureteral  calculus.   PAST MEDICAL HISTORY:  History of recurrent ureterolithiasis, status post  ureteroscopic stone extraction in 03-08-95; status post fundoplication in 03/08/95;  history of gout and hypertension.   MEDICATIONS:  1. Hydrocodone p.r.n. for pain.  2. Phenergan p.r.n. for nausea.  3. Colchicine 0.6 mg one p.o. daily.  4. Hydrochlorothiazide 12.5 mg one p.o. daily.  5. Bayer Aspirin 81 mg p.o. daily, which has been stopped for the     lithotripsy.   ALLERGIES:  SULFA and CODEINE.   PHYSICAL EXAMINATION:   GENERAL:  Height 6 feet.  Weight 225 pounds.  HEENT:  Normal.  NECK:  No masses.  LUNGS:  Lungs clear to auscultation.  HEART:  Regular rate and rhythm.  No murmurs.  ABDOMEN:  Abdomen soft.  No palpable flank mass.  Mild right costovertebral  angle tenderness is noted.  Bladder not palpable.  No suprapubic tenderness.  GU:  Penis and testes are normal.   IMPRESSION:  Right upper ureteral calculus with obstruction, right  hydronephrosis, status post right ureteral stent placement.    PLAN:  I have discussed with the patient regarding the diagnosis, operative  details, the outcome, possible risks and complications.  He is scheduled to  undergo ESWL of the right upper ureteral calculus.  Dennie Maizes, M.D.    SK/MEDQ  D:  04/04/2003  T:  04/04/2003  Job:  [id number]

## 2011-03-13 NOTE — Op Note (Signed)
Gary [name], Gary [name]                          ACCOUNT NO.:  [id number]   MEDICAL RECORD NO.:  [id number]                   PATIENT TYPE:  AMB   LOCATION:  DAY                                  FACILITY:  [facility]   PHYSICIAN:  Dennie Maizes, M.D.                DATE OF BIRTH:  October 24, 1951   DATE OF PROCEDURE:  DATE OF DISCHARGE:                                 OPERATIVE REPORT   PREOPERATIVE DIAGNOSES:  1. Right upper ureteral calculus with obstruction.  2. Right renal colic.  3. Right hydronephrosis.  4. Bilateral renal calculi.   POSTOPERATIVE DIAGNOSES:  1. Right upper ureteral calculus with obstruction.  2. Right renal colic.  3. Right hydronephrosis.  4. Bilateral renal calculi.   PROCEDURE:  1. Cystoscopy.  2. Right retrograde pyelogram.  3. Right ureteral stent placement.   SURGEON:  Dennie Maizes, M.D.   ANESTHESIA:  General   COMPLICATIONS:  None   INDICATIONS FOR PROCEDURE:  This 60 year old male was evaluated for right  renal colic.  X-ray revealed right upper ureteral calculus with obstruction.  The calculus measured about 6 mm in size.  The patient was unable to pass  the stone. He was taken to the OR today for cystoscopy, right retrograde  pyelogram, and right ureteral stent placement.  The stone will likely be  treated to ESL as an outpatient.   DESCRIPTION OF PROCEDURE:  General anesthesia was induced, and the patient  was placed on the OR table in the dorsal lithotomy position.  The lower  abdomen and genitalia were prepped and draped in a sterile fashion.  Cystoscopy was done with the 25 [place] scope.  The urethral prostate, and  bladder were normal.  A 5 French wedge catheter was then placed in the left  ureteral orifice.  About 7 cc of Renografin 60 was injected into the  collecting system on the right side.  C-arm fluoroscopy was done.  The  distal ureter was normal.  There were 2 filling defects in the upper ureter  at the level of the L4  transverse process. The collecting system above the  filling defects were found to be moderately dilated.   A 5 French open-ended catheter was then placed in the right distal ureter.  A 0.038 Benson guidewire with the flexible tip was then advanced into the  renal pelvis without any difficulty.  The open-ended catheter was then  removed.  A 6 French 26-cm sized stent was then inserted into the right  collecting system without any difficulty.  The instruments are removed.  The  patient tolerated the procedure well.  He was transferred to the PACU in a  satisfactory condition.  Dennie Maizes, M.D.    SK/MEDQ  D:  03/22/2003  T:  03/22/2003  Job:  [id number]

## 2011-03-13 NOTE — Cardiovascular Report (Signed)
[place]. [facility]  Patient:    Gary [name], Gary [name]                       MRN: [id number] Proc. Date: 12/29/00 Adm. Date:  [id number] Attending:  Virgina Evener CC:         Hilario Quarry, M.D.  Cath Lab  [facility]   Cardiac Catheterization  INDICATIONS:  Mr. Dorthy Hustead is a 60 year old white male with a history of partial gastrectomy with peptic ulcer disease/Barretts esophagus, history of psoriasis, history of hepatitis B, and recent mild blood pressure elevation. He presented with intermittent chest pain off and on yesterday and today experienced 2-1/2 hours of chest pain leading to his [facility] emergency room evaluation.  ECG was not diagnostic of any acute injury.  Chest pain improved with nitroglycerin.  He was transferred to [facility] and is now undergoing diagnostic cardiac catheterization.  HEMODYNAMIC DATA:  Central aortic pressure is 124/74, left ventricular pressure 124/21.  ANGIOGRAPHIC DATA:  The left main coronary artery is angiographically normal and bifurcated into an LAD and left circumflex system.  The LAD gave rise to two diagonal vessels.  There was 30 to 40% narrowing at the proximal portion of this second diagonal vessel.  The circumflex was a dominant vessel.  There was a large proximal marginal vessel which extended to the apex.  There was 30% narrowing in this marginal vessel.  There also was 30% somewhat eccentric narrowing in the AV groove circumflex between the OM1 and OM2 vessel.  The circumflex ended in the PDA and posterolateral coronary artery.  The right coronary artery was a small nondominant normal vessel.  Biplane cine left ventriculography revealed normal LV function without focal segmental wall motion abnormalities.  Distal aortography did not demonstrate any region of significant aortoiliac disease.  There was no renal artery stenosis.  IMPRESSION: 1.  Normal left ventricular function. 2. Mild coronary artery disease with 30 to 40% ostial narrowing of the second    diagonal vessel; 30% narrowing in the proximal to midportion of a large    OM1 vessel with 30% stenosis in the AV groove portion of this large    dominant left circumflex coronary system; and normal nondominant right    coronary artery.  RECOMMENDATION:  Medical therapy. DD:  12/29/00 TD:  12/30/00 Job: [id number]

## 2011-03-13 NOTE — H&P (Signed)
Gary [name], Gary [name]                          ACCOUNT NO.:  [id number]   MEDICAL RECORD NO.:  [id number]                   PATIENT TYPE:  AMB   LOCATION:  DAY                                  FACILITY:  [facility]   PHYSICIAN:  Dennie Maizes, M.D.                DATE OF BIRTH:  [birth year]   DATE OF ADMISSION:  03/22/2003  DATE OF DISCHARGE:                                HISTORY & PHYSICAL   CHIEF COMPLAINT:  Severe right flank pain radiating to the front, nausea.   HISTORY OF PRESENT ILLNESS:  This 60 year old male has a past history of  recurrent ureterolithiasis. He has undergone ureteroscopy and stone  extraction in 04/01/1995. He has passed several stones since that time.   He experienced sudden onset of severe right flank pain radiating to the  front since Mar 19, 2003. This was associated with nausea and vomiting. The  patient had severe pain on a scale of 9/10. He went to the emergency room at  [facility] for further evaluation. A noncontrast spiral CT of the  abdomen and pelvis revealed a 6 mm size right upper ureteral calculus with  obstruction and hydronephrosis. Bilateral small renal calculi have also been  noted. The patient is unable to pass the stone. He had severe persistent  pain. He was brought to the hospital for cystoscopy, right retrograde  pyelogram and right ureteral stent placement. The stone will later be  treated with ESL as an outpatient.   The patient did not having any voiding difficulty, fever, chills, gross  hematuria, or dysuria present.   PAST MEDICAL HISTORY:  History of recurrent ureterolithiasis status post  ureteroscopy stone extraction in 1995/04/01, status post fundoplication in 01-Apr-1995.  History of gout and hypertension.   MEDICATIONS:  1. Hydrocodone p.r.n. for pain.  2. Phenergan p.r.n. for nausea.  3. Colchicine 0.6 mg one p.o. q.d.  4. Hydrochlorathiazide12.5 mg one p.o. q.d.  5. Bayer aspirin 81 mg p.o. q.d., which was stopped for the  surgery.   ALLERGIES:  SULFA and CODEINE.   PHYSICAL EXAMINATION:  VITAL SIGNS:  Height 6 feet, weight 225 pounds.  HEENT:  Normal.  NECK:  No masses.  LUNGS:  Clear to auscultation.  HEART:  Regular rate and rhythm, no murmurs.  ABDOMEN:  Soft, no palpable frank mass. Mild right costovertebral angle  tenderness is noted. Bladder not palpable, no suprapubic tenderness.  PELVIC:  Penis and testis are normal.   Urinalysis revealed microhematuria.   IMPRESSION:  Right upper ureteral calculus with obstruction, right  hydronephrosis, right renal colic, bilateral small renal calculi.   PLAN:  Cystoscopy, right retrograde pyelogram and right ureteral stent  placement and anesthesia in the hospital. The obstructing stone will later  be treated with ESL as an outpatient. I will discuss with the patient  regarding the diagnosis, operative details, alternative treatments, outcome,  possible risks and complications, and he  has agreed for the procedure to be  done.                                               Dennie Maizes, M.D.    SK/MEDQ  D:  03/21/2003  T:  03/21/2003  Job:  [id number]

## 2011-03-13 NOTE — H&P (Signed)
[place]. [facility]  Patient:    Gary [name], Gary [name]                       MRN: [id number] Adm. Date:  [id number] Disc. Date: [id number] Attending:  Virgina Evener Dictator:   Delice Bison Jernejcic, P.A.                         History and Physical  DATE OF BIRTH:  [birth year]  ADMISSION DIAGNOSES: 1. Chest pain, rule out myocardial infarction. 2. History of heavy tobacco abuse. 3. History of alcoholism. 4. History of peptic ulcer disease, Barretts esophagus, and partial    gastrectomy due to alcoholism in the past. 5. Unknown lipid status. 6. Hepatitis B. 7. Psoriasis.  CHIEF COMPLAINT:  Chest pain x 2.5 hours.  HISTORY OF PRESENT ILLNESS:  Gary [name] is a 59 year old divorced white male new patient to [facility] transferred from [facility] today to evaluate chest pain.  Apparently, last night while driving, he developed onset of substernal chest pressure with abdominal discomfort, tachycardic irregular palpitations, which eventually eased off.  He then woke up around 3:30 a.m. secondary to substernal chest pain and had diaphoresis and shortness of breath with discomfort down the left arm.  He went to [facility] emergency room and was treated with oxygen, sublingual nitroglycerin, and baby aspirin with complete relief of his symptoms.  EKG was nonacute and first enzymes were negative.  He states he has never felt this way before.  He denies PND, orthopnea, presyncope, or syncope.  Denies heart attacks, hypertension, or hyperlipidemia.  ALLERGIES:  SULFA.  MEDICATIONS:  Ibuprofen p.r.n.  PAST MEDICAL HISTORY: 1. Positive for hepatitis B in 2001. 2. History of malaria from [place]. 3. Partial gastrectomy around [phone] secondary to peptic ulcer disease and    Barretts esophagus from alcoholism. 4. Psoriasis.  Denies CAD, MI, CVA, hypertension.  Unknown lipid status.  SOCIAL HISTORY:  The patient is  divorced, lives alone.  Father of two.  Quit smoking on New Years 2002.  Prior to that he was a three to four packs per day smoker for 40+ years.  History of alcoholism, quit five years ago and now drinks only three beers a week.  He is a Consulting civil engineer.  FAMILY HISTORY:  Mother alive, has heart disease age 66.  Father deceased age 29, had heart disease and diabetes.  He thinks his first MI was in his 6s or 31s.  He has two brothers - one with heart disease.  REVIEW OF SYSTEMS:  See HPI.  Otherwise unremarkable.  PHYSICAL EXAMINATION:  VITAL SIGNS:  Blood pressure 140/80, respirations 20, pulse 90.  GENERAL:  The patient is alert and oriented x 3, in no distress sitting on the exam table.  HEENT:  Normocephalic, atraumatic.  PERRL, EOMI.  Nares patent, pharynx benign.  NECK:  Supple without bruits or masses.  No JVD.  CARDIAC:  Regular rate and rhythm with a normal S1, S2.  No murmurs, gallops, or rubs.  LUNGS:  Clear to auscultation.  ABDOMEN:  Soft, nontender, nondistended.  Normal active bowel sounds x 4 quadrants.  No HSM.  EXTREMITIES:  Without edema.  Distal pulses 2+ bilaterally.  LABORATORY DATA:  EKG shows normal sinus rhythm with an interventricular conduction delay.  No ST or T wave changes.  Chest x-ray reveals cardiomegaly but no acute changes.  Laboratory studies shows WBC  4.4, hemoglobin 15, hematocrit 46, platelet 153. Sodium 139, potassium 4.4, BUN 18, creatinine 1.1, glucose 117.  LFTs within normal limits.  INR 1.1.  Cardiac enzymes negative x 1.  IMPRESSION:  This is a 60 year old patient with a history of borderline elevated cholesterol though does not know what it is recently, hepatitis B, extensive tobacco abuse and alcoholism, who presents as a transfer from [facility] for evaluation of chest pain.  This pain is different than his prior GI/peptic ulcer disease pain.  We will keep the patient n.p.o. and plan cardiac catheterization with  possible intervention today.  The risks and benefits of the procedure have been explained and the patient wishes to proceed. DD:  12/30/00 TD:  12/31/00 Job: [id number]

## 2011-03-13 NOTE — Op Note (Signed)
Gary [name], Gary [name]                          ACCOUNT NO.:  [id number]   MEDICAL RECORD NO.:  [id number]                  PATIENT TYPE:   LOCATION:                                       FACILITY:   PHYSICIAN:  Dennie Maizes, M.D.                DATE OF BIRTH:   DATE OF PROCEDURE:  09/05/2003  DATE OF DISCHARGE:                                 OPERATIVE REPORT   PREOPERATIVE DIAGNOSES:  1. Left upper ureteral calculus with obstruction.  2. Left hydronephrosis.  3. Left renal colic.   POSTOPERATIVE DIAGNOSES:  1. Left upper ureteral calculus with obstruction.  2. Left hydronephrosis.  3. Left renal colic.   PROCEDURE:  1. Cystoscopy.  2. Left retrograde pyelogram.  3. Left ureteroscopy.  4. Left ureteral stent placement.   SURGEON:  Dennie Maizes, M.D.   ANESTHESIA:  General.   COMPLICATIONS:  None.   ESTIMATED BLOOD LOSS:  Minimal.   DRAINS:  A 6 French 26-cm size, left ureteral stent.   INDICATIONS FOR PROCEDURE:  This 60 year old male has a past history of  recurrent urolithiasis. He has undergone multiple procedures for stones.  He  was evaluated for severe left flank pain.  X-rays reveal an 8-mm size, upper  ureteral calculus and a 4-mm size left lower pole renal calculus.  The  patient was unable to pass the stone and he had severe persistent pain.  The  patient was taken to the OR today for cystoscopy, left retrograde pyelogram,  ureteroscopy, and Holmium laser lithotripsy of the left upper ureteral  calculus.   DESCRIPTION OF PROCEDURE:  General anesthesia was induced and the patient  was placed on the OR table in the dorsal lithotomy position.  The lower  abdomen and genitalia were prepped and draped in a sterile fashion.  Meatal  stenosis was noted.  The urethral meatus was dilated up to 26 [place].   Cystoscopy was done with the 25 [place] scope.  This revealed normal urethra  and mild enlargement of the prostate. The bladder was then examined and  found to be normal.  A 5 French wedge catheter was then placed in the right  ureteral orifice and a retrograde pyelogram was done by using about 7 cc of  Renographin-60 using C-arm fluoroscopy.  The distal ureter was normal.  There was a large filling defect in the left upper ureter with the proximal  hydroureter and hydronephrosis.   A 5 French open ended catheter was then placed in the left ureteral orifice.  A 0.038-inch Benson guidewire with a flexible tip was inserted into the left  collecting system.  Dilation of the left distal ureter was done with the  ureteral axis sheath.  It was not possible to insert the ureteral axis  sheath beyond the distal ureter.  The ureteral axis sheath was then removed.  Ureteroscopy was done with the 7.5 French rigid ureteroscope.   Examination was  done up to the upper ureter.  The stone could not be seen.  A safety guidewire was then inserted into the left renal collecting system.  Over the safety guidewire and flexible ureteroscope was then inserted into  the left renal pelvis.  It was not possible to see the middle and lower  calyces.  I could not see any stone in the renal pelvis. The scope was then  carefully withdrawn examining the ureter.  There was no evidence of any  stone in the proximal as well as distal ureter.  There was some erythema and  edema in the left upper ureter probably at the site of stone impaction.  The  flexible ureteroscope was then removed.   A 6 [place], 26-cm size stent was then inserted through the cystoscope and  placed in the left collecting system.  The cystoscope was then removed.  The  patient was transferred to the PACU in a satisfactory condition.   I feel that the proximal ureteral stone has migrated to the kidney and I  could not see the stone with the flexible ureteroscope.  I plan to do an x-  ray of the KUB area in the postoperative period.  If the stone is located in  the kidney he may need extracorporeal  shock wave lithotripsy of the left  renal calculus.      ___________________________________________                                            Dennie Maizes, M.D.   SK/MEDQ  D:  09/05/2003  T:  09/05/2003  Job:  [id number]

## 2011-03-13 NOTE — H&P (Signed)
NAME:  Gary [name], Gary [name]                        ACCOUNT NO.:  [id number]   MEDICAL RECORD NO.:  [id number]                   PATIENT TYPE:  AMB   LOCATION:  DAY                                  FACILITY:  [facility]   PHYSICIAN:  Dennie Maizes, M.D.                DATE OF BIRTH:  08/07/1951   DATE OF ADMISSION:  09/05/2003  DATE OF DISCHARGE:                                HISTORY & PHYSICAL   CHIEF COMPLAINT:  Severe left flank pain radiating to the front, left upper  ureteral calculus with obstruction.   HISTORY OF PRESENT ILLNESS:  This 60 year old male has a past history of  recurrent urolithiasis.  He has been having severe left flank pain radiating  to the front for a few days.  He has a past history of urolithiasis.  He has  undergone ESL of right upper ureteral calculus in June 2004.  He is unable  to pass the stone and his pain is not adequately controlled with p.o.  analgesics.  He is brought to the day hospital today for cystoscopy, left  retrograde pyelogram, ureteroscopy, and holmium laser lithotripsy of the  left upper ureteral calculus.   PAST MEDICAL HISTORY:  Recurrent urolithiasis, status post ureteroscopy  stone extraction, status post ESL, history of bronchial asthma.   MEDICATIONS:  Theophylline, Prilosec, Levaquin, and Percocet.   ALLERGIES:  SULFA.   EXAMINATION:  GENERAL:  The patient is in moderate pain.  HEAD/EYES/NOSE AND THROAT:  Normal.  NECK:  No masses.  LUNGS:  Clear to auscultation.  HEART:  Regular rate and rhythm.  No murmurs.  ABDOMEN:  Soft.  No palpable flank mass.  Moderate left costovertebral angle  tenderness is noted.  PENIS AND TESTES:  Normal.   KUB:  His recent KUB revealed a 4 mm size left lower polar renal calculus  and 8 mm size left upper ureteral calculus.   IMPRESSION:  Left upper ureteral calculus with obstruction, left renal  colic, small left renal calculus.   PLAN:  Cystoscopy, left retrograde pyelogram, ureteroscopy,  holmium laser  lithotripsy of the left upper ureteral calculus under anesthesia in day  hospital.  I have discussed with the patient regarding the diagnosis,  operative details, alternate treatments, outcome, possible risks and  complications and he has agreed for the procedure to be done.    MEDICATIONS/ALLERGIES:  The patient's current medications are Indocin 25 mg  one p.o. t.i.d., atenolol 25 mg one p.o. once daily, aspirin one p.o. once  daily, Phenergan 25 mg one p.o. q.8h. p.r.n. nausea, colchicine 0.6 mg one  p.o. once daily.  He is allergic to SULFA and CODEINE.     ___________________________________________                                         Dennie Maizes,  M.D.   SK/MEDQ  D:  09/05/2003  T:  09/05/2003  Job:  [id number]

## 2011-03-13 NOTE — H&P (Signed)
NAME:  Gary [name], BILL                        ACCOUNT NO.:  [id number]   MEDICAL RECORD NO.:  [id number]                   PATIENT TYPE:   LOCATION:                                       FACILITY:  [facility]   PHYSICIAN:  Dennie Maizes, M.D.                DATE OF BIRTH:  06-07-1951   DATE OF ADMISSION:  09/19/2003  DATE OF DISCHARGE:                                HISTORY & PHYSICAL   CHIEF COMPLAINT:  Severe left flank pain, left renal calculus.   HISTORY OF PRESENT ILLNESS:  [age]-old male has a past history of  recurrent urolithiasis.  He had been having intermittent severe left flank  pain radiating to the front for a few days.  Evaluation revealed an 8-mm-  size left upper ureteral calculus and a small left renal calculus.  The  patient has undergone cystoscopy, left retrograde pyelogram and left  ureteroscopy on September 05, 2003.  Left upper ureteral calculus with  obstruction was noted.  The left upper ureteral calculus migrated to the  left renal pelvis during the procedure and a stent was placed.  The patient  has good pain relief at present.  Followup x-ray of the KUB area revealed an  8-mm-size left renal calculus.  The patient is brought to the day hospital  today for ESWL of the left renal calculus.   PAST MEDICAL HISTORY:  1. Recurrent urolithiasis, status post ureteroscopic stone extraction,     status post ESWL.  2. History of bronchial asthma.  3. Gout.   MEDICATIONS:  1. Indocin 25 mg one p.o. t.i.d.  2. Atenolol 25 mg one p.o. daily.  3. Aspirin one p.o. daily.  4. Phenergan 25 mg one p.o. q.8 h. p.r.n. nausea.  5. Colchicine 0.6 mg one p.o. daily.  6. Percocet p.r.n. for pain.   The aspirin and Indocin have been stopped for the lithotripsy.   ALLERGIES:  SULFA and CODEINE.   EXAMINATION:  HEENT:  Head, eyes, ears, nose and throat normal.  NECK:  No masses.  LUNGS:  Lungs clear to auscultation.  HEART:  Regular rate and rhythm.  No murmurs.  ABDOMEN:  Abdomen soft.  No palpable flank mass or CVA tenderness.  Bladder  not palpable.  GU:  Penis and testes are normal.   IMPRESSION:  Left renal calculi, 8 mm and 4 mm in size.   PLAN:  ESWL of the left renal calculi with IV sedation in day hospital.  I  have discussed with the patient regarding the diagnosis, the operative  details, the alternative treatment, the outcome, possible risks and  complications and he has agreed for the procedure to be done.     ___________________________________________  Dennie Maizes, M.D.   SK/MEDQ  D:  09/19/2003  T:  09/19/2003  Job:  [id number]

## 2015-01-28 ENCOUNTER — Encounter (HOSPITAL_COMMUNITY): Admission: EM | Disposition: E | Payer: Self-pay | Source: Home / Self Care | Attending: Internal Medicine

## 2015-01-28 ENCOUNTER — Encounter (HOSPITAL_COMMUNITY): Payer: Self-pay | Admitting: Anesthesiology

## 2015-01-28 ENCOUNTER — Inpatient Hospital Stay (HOSPITAL_COMMUNITY): Payer: 59

## 2015-01-28 ENCOUNTER — Inpatient Hospital Stay (HOSPITAL_COMMUNITY): Payer: 59 | Admitting: Anesthesiology

## 2015-01-28 ENCOUNTER — Emergency Department (HOSPITAL_COMMUNITY): Payer: 59

## 2015-01-28 ENCOUNTER — Inpatient Hospital Stay (HOSPITAL_COMMUNITY)
Admission: EM | Admit: 2015-01-28 | Discharge: 2015-02-24 | DRG: 326 | Disposition: E | Payer: 59 | Attending: Internal Medicine | Admitting: Internal Medicine

## 2015-01-28 DIAGNOSIS — K920 Hematemesis: Principal | ICD-10-CM | POA: Diagnosis present

## 2015-01-28 DIAGNOSIS — R069 Unspecified abnormalities of breathing: Secondary | ICD-10-CM

## 2015-01-28 DIAGNOSIS — Z8711 Personal history of peptic ulcer disease: Secondary | ICD-10-CM | POA: Diagnosis not present

## 2015-01-28 DIAGNOSIS — J9601 Acute respiratory failure with hypoxia: Secondary | ICD-10-CM | POA: Diagnosis present

## 2015-01-28 DIAGNOSIS — R4020 Unspecified coma: Secondary | ICD-10-CM | POA: Diagnosis not present

## 2015-01-28 DIAGNOSIS — R578 Other shock: Secondary | ICD-10-CM | POA: Diagnosis present

## 2015-01-28 DIAGNOSIS — E872 Acidosis, unspecified: Secondary | ICD-10-CM

## 2015-01-28 DIAGNOSIS — D649 Anemia, unspecified: Secondary | ICD-10-CM

## 2015-01-28 DIAGNOSIS — K921 Melena: Secondary | ICD-10-CM | POA: Diagnosis present

## 2015-01-28 DIAGNOSIS — J9600 Acute respiratory failure, unspecified whether with hypoxia or hypercapnia: Secondary | ICD-10-CM

## 2015-01-28 DIAGNOSIS — K254 Chronic or unspecified gastric ulcer with hemorrhage: Secondary | ICD-10-CM | POA: Diagnosis present

## 2015-01-28 DIAGNOSIS — R579 Shock, unspecified: Secondary | ICD-10-CM

## 2015-01-28 DIAGNOSIS — K2980 Duodenitis without bleeding: Secondary | ICD-10-CM | POA: Diagnosis present

## 2015-01-28 DIAGNOSIS — Z419 Encounter for procedure for purposes other than remedying health state, unspecified: Secondary | ICD-10-CM

## 2015-01-28 DIAGNOSIS — I9589 Other hypotension: Secondary | ICD-10-CM

## 2015-01-28 DIAGNOSIS — J939 Pneumothorax, unspecified: Secondary | ICD-10-CM | POA: Insufficient documentation

## 2015-01-28 DIAGNOSIS — K922 Gastrointestinal hemorrhage, unspecified: Secondary | ICD-10-CM

## 2015-01-28 DIAGNOSIS — Z978 Presence of other specified devices: Secondary | ICD-10-CM

## 2015-01-28 DIAGNOSIS — R9431 Abnormal electrocardiogram [ECG] [EKG]: Secondary | ICD-10-CM

## 2015-01-28 DIAGNOSIS — I469 Cardiac arrest, cause unspecified: Secondary | ICD-10-CM

## 2015-01-28 DIAGNOSIS — Z882 Allergy status to sulfonamides status: Secondary | ICD-10-CM

## 2015-01-28 DIAGNOSIS — Z66 Do not resuscitate: Secondary | ICD-10-CM | POA: Diagnosis present

## 2015-01-28 DIAGNOSIS — D62 Acute posthemorrhagic anemia: Secondary | ICD-10-CM | POA: Diagnosis present

## 2015-01-28 DIAGNOSIS — D689 Coagulation defect, unspecified: Secondary | ICD-10-CM | POA: Diagnosis present

## 2015-01-28 HISTORY — PX: LAPAROTOMY: SHX154

## 2015-01-28 HISTORY — PX: ESOPHAGOGASTRODUODENOSCOPY: SHX5428

## 2015-01-28 LAB — COMPREHENSIVE METABOLIC PANEL
ALBUMIN: 2 g/dL — AB (ref 3.5–5.2)
ALT: 560 U/L — ABNORMAL HIGH (ref 0–53)
ANION GAP: 23 — AB (ref 5–15)
AST: 652 U/L — ABNORMAL HIGH (ref 0–37)
Alkaline Phosphatase: 57 U/L (ref 39–117)
BILIRUBIN TOTAL: 0.3 mg/dL (ref 0.3–1.2)
BUN: 33 mg/dL — AB (ref 6–23)
CALCIUM: 8.7 mg/dL (ref 8.4–10.5)
CHLORIDE: 114 mmol/L — AB (ref 96–112)
CO2: 12 mmol/L — AB (ref 19–32)
CREATININE: 2.05 mg/dL — AB (ref 0.50–1.35)
GFR calc Af Amer: 38 mL/min — ABNORMAL LOW (ref >90)
GFR calc non Af Amer: 33 mL/min — ABNORMAL LOW (ref >90)
Glucose, Bld: 358 mg/dL — ABNORMAL HIGH (ref 70–99)
POTASSIUM: 4.8 mmol/L (ref 3.5–5.1)
Sodium: 149 mmol/L — ABNORMAL HIGH (ref 135–145)
TOTAL PROTEIN: 4.1 g/dL — AB (ref 6.0–8.3)

## 2015-01-28 LAB — I-STAT ARTERIAL BLOOD GAS, ED
ACID-BASE DEFICIT: 27 mmol/L — AB (ref 0.0–2.0)
BICARBONATE: 7.4 meq/L — AB (ref 20.0–24.0)
O2 Saturation: 99 %
TCO2: 9 mmol/L (ref 0–100)
pCO2 arterial: 53.2 mmHg — ABNORMAL HIGH (ref 35.0–45.0)
pH, Arterial: 6.75 — CL (ref 7.350–7.450)
pO2, Arterial: 239 mmHg — ABNORMAL HIGH (ref 80.0–100.0)

## 2015-01-28 LAB — CBC
HEMATOCRIT: 19.2 % — AB (ref 39.0–52.0)
Hemoglobin: 5.7 g/dL — CL (ref 13.0–17.0)
MCH: 29.7 pg (ref 26.0–34.0)
MCHC: 29.7 g/dL — ABNORMAL LOW (ref 30.0–36.0)
MCV: 100 fL (ref 78.0–100.0)
PLATELETS: 138 10*3/uL — AB (ref 150–400)
RBC: 1.92 MIL/uL — AB (ref 4.22–5.81)
RDW: 15.4 % (ref 11.5–15.5)
WBC: 9.9 10*3/uL (ref 4.0–10.5)

## 2015-01-28 LAB — POCT I-STAT, CHEM 8
BUN: 47 mg/dL — AB (ref 6–23)
CHLORIDE: 109 mmol/L (ref 96–112)
Calcium, Ion: 1.04 mmol/L — ABNORMAL LOW (ref 1.13–1.30)
Creatinine, Ser: 1.6 mg/dL — ABNORMAL HIGH (ref 0.50–1.35)
Glucose, Bld: 369 mg/dL — ABNORMAL HIGH (ref 70–99)
HCT: 32 % — ABNORMAL LOW (ref 39.0–52.0)
Hemoglobin: 10.9 g/dL — ABNORMAL LOW (ref 13.0–17.0)
Potassium: 5.7 mmol/L — ABNORMAL HIGH (ref 3.5–5.1)
SODIUM: 141 mmol/L (ref 135–145)
TCO2: 12 mmol/L (ref 0–100)

## 2015-01-28 LAB — CBC WITH DIFFERENTIAL/PLATELET
BASOS ABS: 0.1 10*3/uL (ref 0.0–0.1)
Basophils Relative: 1 % (ref 0–1)
Eosinophils Absolute: 0.1 10*3/uL (ref 0.0–0.7)
Eosinophils Relative: 1 % (ref 0–5)
HEMATOCRIT: 29.1 % — AB (ref 39.0–52.0)
Hemoglobin: 9.4 g/dL — ABNORMAL LOW (ref 13.0–17.0)
LYMPHS ABS: 4.4 10*3/uL — AB (ref 0.7–4.0)
Lymphocytes Relative: 31 % (ref 12–46)
MCH: 30 pg (ref 26.0–34.0)
MCHC: 32.3 g/dL (ref 30.0–36.0)
MCV: 93 fL (ref 78.0–100.0)
MONO ABS: 0.6 10*3/uL (ref 0.1–1.0)
MONOS PCT: 4 % (ref 3–12)
NEUTROS ABS: 8.9 10*3/uL — AB (ref 1.7–7.7)
Neutrophils Relative %: 63 % (ref 43–77)
PLATELETS: 102 10*3/uL — AB (ref 150–400)
RBC: 3.13 MIL/uL — ABNORMAL LOW (ref 4.22–5.81)
RDW: 14.8 % (ref 11.5–15.5)
WBC: 14.1 10*3/uL — AB (ref 4.0–10.5)

## 2015-01-28 LAB — I-STAT CHEM 8, ED
BUN: 37 mg/dL — AB (ref 6–23)
CALCIUM ION: 1.1 mmol/L — AB (ref 1.13–1.30)
CHLORIDE: 113 mmol/L — AB (ref 96–112)
CREATININE: 1.8 mg/dL — AB (ref 0.50–1.35)
GLUCOSE: 344 mg/dL — AB (ref 70–99)
HCT: 16 % — ABNORMAL LOW (ref 39.0–52.0)
Hemoglobin: 5.4 g/dL — CL (ref 13.0–17.0)
Potassium: 4.7 mmol/L (ref 3.5–5.1)
Sodium: 147 mmol/L — ABNORMAL HIGH (ref 135–145)
TCO2: 9 mmol/L (ref 0–100)

## 2015-01-28 LAB — PROTIME-INR
INR: 1.78 — ABNORMAL HIGH (ref 0.00–1.49)
PROTHROMBIN TIME: 20.9 s — AB (ref 11.6–15.2)
Prothrombin Time: >90 seconds — ABNORMAL HIGH (ref 11.6–15.2)

## 2015-01-28 LAB — FIBRINOGEN: Fibrinogen: <60 mg/dL — CL (ref 204–475)

## 2015-01-28 LAB — APTT: aPTT: >200 seconds (ref 24–37)

## 2015-01-28 LAB — MASSIVE TRANSFUSION PROTOCOL ORDER (BLOOD BANK NOTIFICATION)

## 2015-01-28 LAB — I-STAT CG4 LACTIC ACID, ED

## 2015-01-28 LAB — PREPARE RBC (CROSSMATCH)

## 2015-01-28 LAB — CG4 I-STAT (LACTIC ACID): Lactic Acid, Venous: 14.9 mmol/L (ref 0.5–2.0)

## 2015-01-28 LAB — I-STAT TROPONIN, ED: Troponin i, poc: 0.07 ng/mL (ref 0.00–0.08)

## 2015-01-28 LAB — POC OCCULT BLOOD, ED: FECAL OCCULT BLD: POSITIVE — AB

## 2015-01-28 SURGERY — EGD (ESOPHAGOGASTRODUODENOSCOPY)
Anesthesia: Moderate Sedation

## 2015-01-28 SURGERY — LAPAROTOMY, EXPLORATORY
Anesthesia: General | Site: Abdomen

## 2015-01-28 MED ORDER — CALCIUM CHLORIDE 10 % IV SOLN
INTRAVENOUS | Status: DC | PRN
  Administered 2015-01-28: 1 g via INTRAVENOUS

## 2015-01-28 MED ORDER — FENTANYL CITRATE 0.05 MG/ML IJ SOLN: 100.0000 ug | INTRAMUSCULAR | Status: DC | PRN

## 2015-01-28 MED ORDER — ALBUMIN HUMAN 5 % IV SOLN
INTRAVENOUS | Status: DC | PRN
  Administered 2015-01-28 (×3): via INTRAVENOUS

## 2015-01-28 MED ORDER — MIDAZOLAM HCL 2 MG/2ML IJ SOLN
4.0000 mg | Freq: Once | INTRAMUSCULAR | Status: AC
  Administered 2015-01-28: 4 mg via INTRAVENOUS

## 2015-01-28 MED ORDER — ROCURONIUM BROMIDE 100 MG/10ML IV SOLN
INTRAVENOUS | Status: DC | PRN
  Administered 2015-01-28 (×3): 50 mg via INTRAVENOUS

## 2015-01-28 MED ORDER — EPINEPHRINE HCL 0.1 MG/ML IJ SOSY
PREFILLED_SYRINGE | INTRAMUSCULAR | Status: AC | PRN
  Administered 2015-01-28: 1 mg via INTRAVENOUS
  Administered 2015-01-28: 1 via INTRAVENOUS

## 2015-01-28 MED ORDER — SODIUM CHLORIDE 0.9 % IV SOLN: 250.0000 mL | INTRAVENOUS | Status: DC | PRN

## 2015-01-28 MED ORDER — ROCURONIUM BROMIDE 50 MG/5ML IV SOLN
INTRAVENOUS | Status: AC
  Filled 2015-01-28: qty 2

## 2015-01-28 MED ORDER — MIDAZOLAM HCL 5 MG/ML IJ SOLN
1.0000 mg/h | INTRAMUSCULAR | Status: DC
  Administered 2015-01-28 (×2): 10 mg/h via INTRAVENOUS
  Filled 2015-01-28: qty 10

## 2015-01-28 MED ORDER — ATROPINE SULFATE 0.1 MG/ML IJ SOLN
INTRAMUSCULAR | Status: AC
  Filled 2015-01-28: qty 10

## 2015-01-28 MED ORDER — FENTANYL CITRATE 0.05 MG/ML IJ SOLN
100.0000 ug | Freq: Once | INTRAMUSCULAR | Status: AC
  Administered 2015-01-28: 100 ug via INTRAVENOUS

## 2015-01-28 MED ORDER — SODIUM CHLORIDE 0.9 % IV SOLN: Freq: Once | INTRAVENOUS | Status: DC

## 2015-01-28 MED ORDER — PHENYLEPHRINE 40 MCG/ML (10ML) SYRINGE FOR IV PUSH (FOR BLOOD PRESSURE SUPPORT)
PREFILLED_SYRINGE | INTRAVENOUS | Status: AC
  Filled 2015-01-28: qty 10

## 2015-01-28 MED ORDER — ETOMIDATE 2 MG/ML IV SOLN
INTRAVENOUS | Status: AC
  Filled 2015-01-28: qty 20

## 2015-01-28 MED ORDER — SODIUM BICARBONATE 8.4 % IV SOLN
INTRAVENOUS | Status: DC
  Administered 2015-01-28: 17:00:00 via INTRAVENOUS
  Filled 2015-01-28 (×2): qty 150

## 2015-01-28 MED ORDER — MIDAZOLAM HCL 2 MG/2ML IJ SOLN: 2.0000 mg | INTRAMUSCULAR | Status: DC | PRN

## 2015-01-28 MED ORDER — MIDAZOLAM HCL 2 MG/2ML IJ SOLN
INTRAMUSCULAR | Status: AC
  Filled 2015-01-28: qty 2

## 2015-01-28 MED ORDER — NOREPINEPHRINE BITARTRATE 1 MG/ML IV SOLN
0.0000 ug/min | Freq: Once | INTRAVENOUS | Status: AC
  Administered 2015-01-28: 50 ug/min via INTRAVENOUS
  Filled 2015-01-28: qty 4

## 2015-01-28 MED ORDER — HEMOSTATIC AGENTS (NO CHARGE) OPTIME
TOPICAL | Status: DC | PRN
  Administered 2015-01-28: 1 via TOPICAL

## 2015-01-28 MED ORDER — FENTANYL CITRATE 0.05 MG/ML IJ SOLN
INTRAMUSCULAR | Status: AC
  Filled 2015-01-28: qty 2

## 2015-01-28 MED ORDER — CEFAZOLIN SODIUM-DEXTROSE 2-3 GM-% IV SOLR
INTRAVENOUS | Status: DC | PRN
  Administered 2015-01-28: 2 g via INTRAVENOUS

## 2015-01-28 MED ORDER — ROCURONIUM BROMIDE 50 MG/5ML IV SOLN
70.0000 mg | Freq: Once | INTRAVENOUS | Status: DC
  Filled 2015-01-28: qty 7

## 2015-01-28 MED ORDER — LIDOCAINE HCL (CARDIAC) 20 MG/ML IV SOLN
INTRAVENOUS | Status: AC
  Filled 2015-01-28: qty 5

## 2015-01-28 MED ORDER — SODIUM BICARBONATE 8.4 % IV SOLN
INTRAVENOUS | Status: AC
  Filled 2015-01-28: qty 50

## 2015-01-28 MED ORDER — LACTATED RINGERS IV SOLN
INTRAVENOUS | Status: DC | PRN
  Administered 2015-01-28: 18:00:00 via INTRAVENOUS

## 2015-01-28 MED ORDER — VASOPRESSIN 20 UNIT/ML IV SOLN
INTRAVENOUS | Status: DC | PRN
  Administered 2015-01-28: 40 [IU] via INTRAVENOUS
  Administered 2015-01-28: 4 [IU] via INTRAVENOUS

## 2015-01-28 MED ORDER — ETOMIDATE 2 MG/ML IV SOLN
INTRAVENOUS | Status: AC
  Filled 2015-01-28: qty 10

## 2015-01-28 MED ORDER — EPINEPHRINE HCL 0.1 MG/ML IJ SOSY
PREFILLED_SYRINGE | INTRAMUSCULAR | Status: AC
  Filled 2015-01-28: qty 10

## 2015-01-28 MED ORDER — EPINEPHRINE HCL 0.1 MG/ML IJ SOSY
PREFILLED_SYRINGE | INTRAMUSCULAR | Status: DC | PRN
  Administered 2015-01-28 (×2): 1 mg via INTRAVENOUS
  Administered 2015-01-28: 1 via INTRAVENOUS
  Administered 2015-01-28: 1 mg via INTRAVENOUS

## 2015-01-28 MED ORDER — VASOPRESSIN 20 UNIT/ML IV SOLN
0.0300 [IU]/min | INTRAVENOUS | Status: DC
  Filled 2015-01-28 (×3): qty 2

## 2015-01-28 MED ORDER — MIDAZOLAM HCL 5 MG/ML IJ SOLN
INTRAMUSCULAR | Status: AC
  Filled 2015-01-28: qty 2

## 2015-01-28 MED ORDER — CALCIUM CHLORIDE 10 % IV SOLN
INTRAVENOUS | Status: AC
  Filled 2015-01-28: qty 20

## 2015-01-28 MED ORDER — ROCURONIUM BROMIDE 50 MG/5ML IV SOLN
INTRAVENOUS | Status: AC
  Filled 2015-01-28: qty 1

## 2015-01-28 MED ORDER — SUCCINYLCHOLINE CHLORIDE 20 MG/ML IJ SOLN
INTRAMUSCULAR | Status: AC
  Filled 2015-01-28: qty 1

## 2015-01-28 MED ORDER — DEXTROSE 5 % IV SOLN
30.0000 ug/min | INTRAVENOUS | Status: DC
  Filled 2015-01-28: qty 1

## 2015-01-28 MED ORDER — DIPHENHYDRAMINE HCL 50 MG/ML IJ SOLN
INTRAMUSCULAR | Status: AC
  Filled 2015-01-28: qty 1

## 2015-01-28 MED ORDER — SODIUM BICARBONATE 8.4 % IV SOLN
INTRAVENOUS | Status: DC | PRN
  Administered 2015-01-28 (×9): 50 meq via INTRAVENOUS

## 2015-01-28 MED ORDER — NOREPINEPHRINE BITARTRATE 1 MG/ML IV SOLN
0.0000 ug/min | Freq: Once | INTRAVENOUS | Status: AC
  Administered 2015-01-28: 20 ug/min via INTRAVENOUS
  Filled 2015-01-28 (×2): qty 4

## 2015-01-28 MED ORDER — SODIUM BICARBONATE 8.4 % IV SOLN
INTRAVENOUS | Status: DC | PRN
  Administered 2015-01-28: 100 meq via INTRAVENOUS
  Administered 2015-01-28: 50 meq via INTRAVENOUS
  Administered 2015-01-28: 100 meq via INTRAVENOUS

## 2015-01-28 MED ORDER — EPINEPHRINE HCL 1 MG/ML IJ SOLN
INTRAMUSCULAR | Status: DC | PRN
  Administered 2015-01-28: .1 mg via INTRAVENOUS
  Administered 2015-01-28: .5 mg via INTRAVENOUS
  Administered 2015-01-28: .3 mg via INTRAVENOUS
  Administered 2015-01-28: .7 mg via INTRAVENOUS

## 2015-01-28 MED ORDER — SODIUM BICARBONATE 8.4 % IV SOLN: 100.0000 meq | Freq: Once | INTRAVENOUS | Status: DC

## 2015-01-28 MED ORDER — 0.9 % SODIUM CHLORIDE (POUR BTL) OPTIME
TOPICAL | Status: DC | PRN
  Administered 2015-01-28 (×2): 1000 mL

## 2015-01-28 MED ORDER — MAGNESIUM SULFATE 50 % IJ SOLN
INTRAMUSCULAR | Status: DC | PRN
  Administered 2015-01-28: 2 g via INTRAVENOUS

## 2015-01-28 MED ORDER — MIDAZOLAM HCL 5 MG/5ML IJ SOLN
INTRAMUSCULAR | Status: DC | PRN
  Administered 2015-01-28 (×3): 2 mg via INTRAVENOUS

## 2015-01-28 MED ORDER — ROCURONIUM BROMIDE 50 MG/5ML IV SOLN
INTRAVENOUS | Status: DC | PRN
  Administered 2015-01-28: 70 mg via INTRAVENOUS

## 2015-01-28 MED ORDER — EPINEPHRINE HCL 1 MG/ML IJ SOLN
4000.0000 ug | INTRAVENOUS | Status: DC | PRN
  Administered 2015-01-28: 10 ug/min via INTRAVENOUS

## 2015-01-28 MED ORDER — CALCIUM CHLORIDE 10 % IV SOLN
INTRAVENOUS | Status: DC | PRN
  Administered 2015-01-28: 0.5 g via INTRAVENOUS
  Administered 2015-01-28 (×2): 1 g via INTRAVENOUS
  Administered 2015-01-28 (×3): 0.5 g via INTRAVENOUS

## 2015-01-28 MED ORDER — NOREPINEPHRINE BITARTRATE 1 MG/ML IV SOLN
4000.0000 ug | INTRAVENOUS | Status: DC | PRN
  Administered 2015-01-28: 4 ug/min via INTRAVENOUS

## 2015-01-28 MED ORDER — PANTOPRAZOLE SODIUM 40 MG IV SOLR
80.0000 mg | Freq: Once | INTRAVENOUS | Status: AC
  Administered 2015-01-28: 80 mg via INTRAVENOUS
  Filled 2015-01-28: qty 80

## 2015-01-28 MED ORDER — FENTANYL CITRATE 0.05 MG/ML IJ SOLN
10.0000 ug/h | INTRAMUSCULAR | Status: DC
  Administered 2015-01-28: 40 ug/h via INTRAVENOUS
  Filled 2015-01-28: qty 50

## 2015-01-28 MED ORDER — SODIUM CHLORIDE 0.9 % IJ SOLN
INTRAMUSCULAR | Status: AC
  Filled 2015-01-28: qty 10

## 2015-01-28 MED ORDER — BACITRACIN ZINC 500 UNIT/GM EX OINT
TOPICAL_OINTMENT | CUTANEOUS | Status: AC
  Filled 2015-01-28: qty 0.9

## 2015-01-28 MED ORDER — SODIUM BICARBONATE 8.4 % IV SOLN
100.0000 meq | Freq: Once | INTRAVENOUS | Status: DC
  Filled 2015-01-28: qty 100

## 2015-01-28 MED ORDER — MIDAZOLAM HCL 2 MG/2ML IJ SOLN
INTRAMUSCULAR | Status: AC
  Filled 2015-01-28: qty 4

## 2015-01-28 MED ORDER — VASOPRESSIN 20 UNIT/ML IV SOLN
100.0000 [IU] | INTRAVENOUS | Status: DC | PRN
  Administered 2015-01-28: .03 [IU]/min via INTRAVENOUS

## 2015-01-28 MED ORDER — NOREPINEPHRINE BITARTRATE 1 MG/ML IV SOLN: 0.0000 ug/min | Freq: Once | INTRAVENOUS | Status: DC

## 2015-01-28 SURGICAL SUPPLY — 50 items
BLADE SURG ROTATE 9660 (MISCELLANEOUS) IMPLANT
CANISTER SUCTION 2500CC (MISCELLANEOUS) ×5 IMPLANT
CHLORAPREP W/TINT 26ML (MISCELLANEOUS) ×1 IMPLANT
COVER MAYO STAND STRL (DRAPES) IMPLANT
COVER SURGICAL LIGHT HANDLE (MISCELLANEOUS) ×3 IMPLANT
DRAPE LAPAROSCOPIC ABDOMINAL (DRAPES) ×3 IMPLANT
DRAPE PROXIMA HALF (DRAPES) IMPLANT
DRAPE UTILITY XL STRL (DRAPES) ×2 IMPLANT
DRAPE WARM FLUID 44X44 (DRAPE) ×3 IMPLANT
DRSG OPSITE POSTOP 4X10 (GAUZE/BANDAGES/DRESSINGS) IMPLANT
DRSG OPSITE POSTOP 4X8 (GAUZE/BANDAGES/DRESSINGS) IMPLANT
ELECT BLADE 6.5 EXT (BLADE) IMPLANT
ELECT CAUTERY BLADE 6.4 (BLADE) ×2 IMPLANT
ELECT REM PT RETURN 9FT ADLT (ELECTROSURGICAL) ×3
ELECTRODE REM PT RTRN 9FT ADLT (ELECTROSURGICAL) ×1 IMPLANT
GLOVE BIO SURGEON STRL SZ7.5 (GLOVE) ×3 IMPLANT
GLOVE BIOGEL PI IND STRL 8 (GLOVE) ×1 IMPLANT
GLOVE BIOGEL PI INDICATOR 8 (GLOVE) ×2
GOWN STRL REUS W/ TWL LRG LVL3 (GOWN DISPOSABLE) ×2 IMPLANT
GOWN STRL REUS W/ TWL XL LVL3 (GOWN DISPOSABLE) ×1 IMPLANT
GOWN STRL REUS W/TWL LRG LVL3 (GOWN DISPOSABLE) ×6
GOWN STRL REUS W/TWL XL LVL3 (GOWN DISPOSABLE) ×3
HEMOSTAT SNOW SURGICEL 2X4 (HEMOSTASIS) ×6 IMPLANT
KIT BASIN OR (CUSTOM PROCEDURE TRAY) ×7 IMPLANT
KIT ROOM TURNOVER OR (KITS) ×3 IMPLANT
LIGASURE IMPACT 36 18CM CVD LR (INSTRUMENTS) ×2 IMPLANT
MANIFOLD NEPTUNE WASTE (CANNULA) ×2 IMPLANT
NS IRRIG 1000ML POUR BTL (IV SOLUTION) ×8 IMPLANT
PACK GENERAL/GYN (CUSTOM PROCEDURE TRAY) ×3 IMPLANT
PAD ARMBOARD 7.5X6 YLW CONV (MISCELLANEOUS) ×6 IMPLANT
PAD NEG PRESSURE SENSATRAC (MISCELLANEOUS) ×4 IMPLANT
PENCIL BUTTON HOLSTER BLD 10FT (ELECTRODE) IMPLANT
SPECIMEN JAR LARGE (MISCELLANEOUS) IMPLANT
SPONGE ABDOMINAL VAC ABTHERA (MISCELLANEOUS) ×2 IMPLANT
SPONGE LAP 18X18 X RAY DECT (DISPOSABLE) ×8 IMPLANT
SPONGE SURGIFOAM ABS GEL 100 (HEMOSTASIS) ×12 IMPLANT
STAPLER VISISTAT 35W (STAPLE) ×3 IMPLANT
SUCTION POOLE TIP (SUCTIONS) ×5 IMPLANT
SUT PDS AB 1 TP1 96 (SUTURE) ×2 IMPLANT
SUT SILK 1 SH (SUTURE) IMPLANT
SUT SILK 2 0 SH (SUTURE) ×4 IMPLANT
SUT SILK 2 0 SH CR/8 (SUTURE) ×3 IMPLANT
SUT SILK 2 0 TIES 10X30 (SUTURE) ×3 IMPLANT
SUT SILK 3 0 SH CR/8 (SUTURE) ×3 IMPLANT
SUT SILK 3 0 TIES 10X30 (SUTURE) ×3 IMPLANT
TOWEL OR 17X26 10 PK STRL BLUE (TOWEL DISPOSABLE) ×3 IMPLANT
TRAY FOLEY CATH 16FRSI W/METER (SET/KITS/TRAYS/PACK) IMPLANT
TUBE CONNECTING 12'X1/4 (SUCTIONS)
TUBE CONNECTING 12X1/4 (SUCTIONS) IMPLANT
YANKAUER SUCT BULB TIP NO VENT (SUCTIONS) ×2 IMPLANT

## 2015-01-28 NOTE — Significant Event (Signed)
Pt without heart tones, asystole on monitor, time of death confirmed by 2 RNs, Jasper RilingRebecca M Hussein Macdougal and State FarmDevon Lofters.

## 2015-01-28 NOTE — Progress Notes (Signed)
Pt manually ventilated throughout code. ETCO2 ranged from 10-12. Unable to get actual spo2 during code due to poor perfusion. Pt placed on Ventilator at ROSC. Arterial line placed in RR using ultrasound by ED Resident due to inadequate pulse.

## 2015-01-28 NOTE — ED Notes (Signed)
No palpable pulse.  CPR continued.

## 2015-01-28 NOTE — ED Notes (Addendum)
#  4 Unit of O neg blood #[id number]

## 2015-01-28 NOTE — Progress Notes (Signed)
ABG results given to Dr. Steinl. 

## 2015-01-28 NOTE — Op Note (Signed)
[facility] [address] [place], [id number]   OPERATIVE PROCEDURE REPORT  PATIENT :Gary [name], Gary [name]  MR#: [id number] BIRTHDATE :06/02/1951 GENDER: male ENDOSCOPIST: Lorenza BurtonJyothi N Mahlet Jergens, MD ASSISTANT:   Jimmey RalphParker, MurphyHope Honeycutt, April MansonElizabeth Henderson, [place] PROCEDURE DATE: 02/01/2015 PRE-PROCEDURE PREPERATION: Patient's status is not known as this is bbeing done emergently.Marland Kitchen. PRE-PROCEDURE PHYSICAL: Patient presented with hematemesis and melena and permission was procured from his son and Dr. Marchelle Gearingamaswamy to do an emergent EGD. Patient is intubated and on pressors with severe hypotension. Abdomen morbidly obese with minimal bowel sounds. PROCEDURE:     EGD, diagnostic ASA CLASS:     Class V INDICATIONS:     Melena, acute post hemorrhagic anemia. MEDICATIONS:     Patient intubated and paralysed-CCM. TOPICAL ANESTHETIC:   none  DESCRIPTION OF PROCEDURE: The Pentax Gastroscope [id number]  was introduced through the mouth and advanced to the second portion of the duodenum. Visualization was limited due to a large amount of blood in the stomach. The instrument was slowly withdrawn as the mucosa was fully examined. Estimated blood loss is zero unless otherwise noted in this procedure report.      EXAM: The esophagus and gastroesophageal junction were completely normal in appearance with no evidence of varices. The stomach was entered and closely examined.There was a lot of adherent blood noted in the stomach and a large clot was noted in the antrum, proximal to the pylorus. I was not able to visualize any specific source of bleeding. The scope passed easily through the pylorus into the duodenum. Severe duodenitis was noted in the psot-bulbar area. Retroflexed views revealed no abnormalities. The scope was then withdrawn from the patient and the procedure terminated. The patient tolerated the procedure without immediate complications.  IMPRESSION:  Normal appearing  esophagus and GE junction; large clot in the antrum, proximal to the pylorus; no definite source of bleeding identified; severe duodenitis in the duodenal bulb; the small bowel distal to the bulb appeared normal  RECOMMENDATIONS:     Surgical consult ASAP.  REPEAT EXAM:  None planned for now.  DISCHARGE INSTRUCTIONS: None given; patient in the process of going to the OR. _______________________________ eSigned:  Lorenza BurtonJyothi N Bradey Luzier, MD 01/31/2015 6:22 PM   CPT CODES:     [phone] Upper gastrointestinal endoscopy including esophagus, stomach, and either the duodenum and/or jejunum as appropriate; diagnostic, with or without collection of specimen(s) by brushing or washing (separate procedure) DIAGNOSIS CODES:     K92.1 Melena D62 Acute posthemorrhagic anemia.   The ICD and CPT codes recommended by this software are interpretations from the data that the clinical staff has captured with the software.  The verification of the translation of this report to the ICD and CPT codes and modifiers is the sole responsibility of the health care institution and practicing physician where this report was generated.  [employer] will not be held responsible for the validity of the ICD and CPT codes included on this report.  AMA assumes no liability for data contained or not contained herein. CPT is a Publishing rights managerregistered trademark of the [employer].   CC:

## 2015-01-28 NOTE — Consult Note (Signed)
Unassigned patient Reason for Consult: Massive hematemesis and melena. Referring Physician: CCM-Dr. Chase Caller.  Gary [name] is an 64 y.o. male.  HPI: 64 year old white male, found to be in PEA after a call was received by EMS, underwent CPR at home for 40 minutes and again here at the hospital for 40 minutes, intubated and emergency endoscopy requested. Minimal details are available with regards to his history as there is no family here. There is a room mate who helped me connect to his son, Gerald Stabs, who told me that the patient was recently discharged from a hospital in [place] where an "upper GI" and a colonoscopy was done and he was found to have a "bleeding ulcer". We do not have any of his medical records or his medication list available to [place] at this time. He has received 9 units of blood since his arrival in the ER.   No pertinent details of his past medical history are available.  No pertinent details of his past surgical history are available.  No family history available.  Social History:  has no tobacco, alcohol, and drug history on file.  Allergies:  Allergies  Allergen Reactions  . Sulfonamide Derivatives    Medications: I have NOT reviewed the patient's current medication-NO LIST AVAILABLE.  Results for orders placed or performed during the hospital encounter of 02/16/2015 (from the past 48 hour(s))  Type and screen     Status: None (Preliminary result)   Collection Time: 02/20/2015  2:18 PM  Result Value Ref Range   ABO/RH(D) B POS    Antibody Screen NEG    Sample Expiration 01/31/2015    Unit Number [id number]    Blood Component Type RED CELLS,LR    Unit division 00    Status of Unit ISSUED    Unit tag comment VERBAL ORDERS PER DR STEINL    Transfusion Status OK TO TRANSFUSE    Crossmatch Result COMPATIBLE    Donor AG Type NEGATIVE FOR E ANTIGEN    Unit Number [id number]    Blood Component Type RED CELLS,LR    Unit division 00    Status of Unit ISSUED     Unit tag comment VERBAL ORDERS PER DR STEINL    Transfusion Status OK TO TRANSFUSE    Crossmatch Result COMPATIBLE    Donor AG Type NEGATIVE FOR E ANTIGEN    Unit Number [id number]    Blood Component Type RED CELLS,LR    Unit division 00    Status of Unit ISSUED    Unit tag comment VERBAL ORDERS PER DR STEINL    Transfusion Status OK TO TRANSFUSE    Crossmatch Result COMPATIBLE    Donor AG Type NEGATIVE FOR E ANTIGEN    Unit Number [id number]    Blood Component Type RED CELLS,LR    Unit division 00    Status of Unit ISSUED    Unit tag comment VERBAL ORDERS PER DR STEINL    Transfusion Status OK TO TRANSFUSE    Crossmatch Result COMPATIBLE    Donor AG Type NEGATIVE FOR E ANTIGEN    Unit Number [id number]    Blood Component Type RBC LR PHER2    Unit division 00    Status of Unit REL FROM [facility]    Unit tag comment VERBAL ORDERS PER DR STEINL    Transfusion Status OK TO TRANSFUSE    Crossmatch Result PENDING    Unit Number [id number]    Blood Component Type RBC LR PHER2  Unit division 00    Status of Unit REL FROM [facility]    Unit tag comment VERBAL ORDERS PER DR STEINL    Transfusion Status OK TO TRANSFUSE    Crossmatch Result PENDING    Unit Number [id number]    Blood Component Type RED CELLS,LR    Unit division 00    Status of Unit REL FROM [facility]    Unit tag comment VERBAL ORDERS PER DR STEINL    Transfusion Status OK TO TRANSFUSE    Crossmatch Result PENDING    Unit Number [id number]    Blood Component Type RED CELLS,LR    Unit division 00    Status of Unit REL FROM [facility]    Unit tag comment VERBAL ORDERS PER DR STEINL    Transfusion Status OK TO TRANSFUSE    Crossmatch Result PENDING    Unit Number [id number]    Blood Component Type RBC LR PHER1    Unit division 00    Status of Unit ISSUED    Unit tag comment VERBAL ORDERS PER DR STEINL    Transfusion Status OK TO TRANSFUSE    Crossmatch Result COMPATIBLE    Unit Number [id number]     Blood Component Type RBC LR PHER1    Unit division 00    Status of Unit ISSUED    Unit tag comment VERBAL ORDERS PER DR STEINL    Transfusion Status OK TO TRANSFUSE    Crossmatch Result COMPATIBLE    Unit Number [id number]    Blood Component Type RED CELLS,LR    Unit division 00    Status of Unit ISSUED    Unit tag comment VERBAL ORDERS PER DR STEINL    Transfusion Status OK TO TRANSFUSE    Crossmatch Result COMPATIBLE    Unit Number [id number]    Blood Component Type RED CELLS,LR    Unit division 00    Status of Unit ISSUED    Unit tag comment VERBAL ORDERS PER DR STEINL    Transfusion Status OK TO TRANSFUSE    Crossmatch Result COMPATIBLE    Unit Number [id number]    Blood Component Type RED CELLS,LR    Unit division 00    Status of Unit REL FROM [facility]    Unit tag comment VERBAL ORDERS PER DR STEINL    Transfusion Status OK TO TRANSFUSE    Crossmatch Result PENDING    Unit Number [id number]    Blood Component Type RED CELLS,LR    Unit division 00    Status of Unit REL FROM [facility]    Unit tag comment VERBAL ORDERS PER DR STEINL    Transfusion Status OK TO TRANSFUSE    Crossmatch Result PENDING    Unit Number [id number]    Blood Component Type RED CELLS,LR    Unit division 00    Status of Unit REL FROM [facility]    Unit tag comment VERBAL ORDERS PER DR STEINL    Transfusion Status OK TO TRANSFUSE    Crossmatch Result PENDING    Unit Number [id number]    Blood Component Type RED CELLS,LR    Unit division 00    Status of Unit ISSUED    Unit tag comment VERBAL ORDERS PER DR RAMIREZ    Transfusion Status OK TO TRANSFUSE    Crossmatch Result PENDING    Unit Number [id number]    Blood Component Type RED CELLS,LR    Unit division 00    Status of Unit ISSUED  Donor AG Type NEGATIVE FOR E ANTIGEN    Unit tag comment VERBAL ORDERS PER DR RAMIREZ    Transfusion Status OK TO TRANSFUSE    Crossmatch Result PENDING    Unit Number [id number]    Blood  Component Type RED CELLS,LR    Unit division 00    Status of Unit ISSUED    Donor AG Type NEGATIVE FOR E ANTIGEN    Unit tag comment VERBAL ORDERS PER DR RAMIREZ    Transfusion Status OK TO TRANSFUSE    Crossmatch Result PENDING    Unit Number [id number]    Blood Component Type RED CELLS,LR    Unit division 00    Status of Unit ISSUED    Donor AG Type NEGATIVE FOR E ANTIGEN    Unit tag comment VERBAL ORDERS PER DR RAMIREZ    Transfusion Status OK TO TRANSFUSE    Crossmatch Result PENDING    Unit Number [id number]    Blood Component Type RED CELLS,LR    Unit division 00    Status of Unit ISSUED    Donor AG Type NEGATIVE FOR E ANTIGEN    Unit tag comment VERBAL ORDERS PER DR RAMIREZ    Transfusion Status OK TO TRANSFUSE    Crossmatch Result PENDING    Unit Number [id number]    Blood Component Type RED CELLS,LR    Unit division 00    Status of Unit ISSUED    Unit tag comment VERBAL ORDERS PER DR RAMIREZ    Transfusion Status OK TO TRANSFUSE    Crossmatch Result PENDING    Unit Number [id number]    Blood Component Type RED CELLS,LR    Unit division 00    Status of Unit ISSUED    Unit tag comment VERBAL ORDERS PER DR RAMIREZ    Transfusion Status OK TO TRANSFUSE    Crossmatch Result PENDING    Unit Number [id number]    Blood Component Type RED CELLS,LR    Unit division 00    Status of Unit ISSUED    Unit tag comment VERBAL ORDERS PER DR RAMIREZ    Transfusion Status OK TO TRANSFUSE    Crossmatch Result PENDING    Unit Number [id number]    Blood Component Type RED CELLS,LR    Unit division 00    Status of Unit ISSUED    Unit tag comment VERBAL ORDERS PER DR RAMIREZ    Transfusion Status OK TO TRANSFUSE    Crossmatch Result PENDING    Unit Number [id number]    Blood Component Type RED CELLS,LR    Unit division 00    Status of Unit ISSUED    Unit tag comment VERBAL ORDERS PER DR RAMIREZ    Transfusion Status OK TO TRANSFUSE    Crossmatch Result  PENDING   Prepare fresh frozen plasma     Status: None   Collection Time: 02/03/2015  2:18 PM  Result Value Ref Range   Unit Number [id number]    Blood Component Type LIQ PLASMA    Unit division 00    Status of Unit REL FROM [facility]    Unit tag comment VERBAL ORDERS PER DR STEINL    Transfusion Status OK TO TRANSFUSE    Unit Number [id number]    Blood Component Type LIQ PLASMA    Unit division 00    Status of Unit REL FROM [facility]    Unit tag comment VERBAL ORDERS PER DR STEINL    Transfusion Status OK TO TRANSFUSE  CBC     Status: Abnormal   Collection Time: 02/07/2015  2:38 PM  Result Value Ref Range   WBC 9.9 4.0 - 10.5 K/uL    Comment: WHITE COUNT CONFIRMED ON SMEAR ADJUSTED FOR NUCLEATED RBC'S    RBC 1.92 (L) 4.22 - 5.81 MIL/uL   Hemoglobin 5.7 (LL) 13.0 - 17.0 g/dL    Comment: REPEATED TO VERIFY SPECIMEN CHECKED FOR CLOTS CRITICAL RESULT CALLED TO, READ BACK BY AND VERIFIED WITH: B MICHAEL,RN 1533 02/17/2015 WBOND    HCT 19.2 (L) 39.0 - 52.0 %   MCV 100.0 78.0 - 100.0 fL   MCH 29.7 26.0 - 34.0 pg   MCHC 29.7 (L) 30.0 - 36.0 g/dL   RDW 15.4 11.5 - 15.5 %   Platelets 138 (L) 150 - 400 K/uL  Protime-INR (if patient is taking Coumadin)     Status: Abnormal   Collection Time: 02/23/2015  2:38 PM  Result Value Ref Range   Prothrombin Time 20.9 (H) 11.6 - 15.2 seconds   INR 1.78 (H) 0.00 - 1.49  Comprehensive metabolic panel     Status: Abnormal   Collection Time: 02/04/2015  2:38 PM  Result Value Ref Range   Sodium 149 (H) 135 - 145 mmol/L   Potassium 4.8 3.5 - 5.1 mmol/L   Chloride 114 (H) 96 - 112 mmol/L   CO2 12 (L) 19 - 32 mmol/L   Glucose, Bld 358 (H) 70 - 99 mg/dL   BUN 33 (H) 6 - 23 mg/dL   Creatinine, Ser 2.05 (H) 0.50 - 1.35 mg/dL   Calcium 8.7 8.4 - 10.5 mg/dL   Total Protein 4.1 (L) 6.0 - 8.3 g/dL   Albumin 2.0 (L) 3.5 - 5.2 g/dL   AST 652 (H) 0 - 37 U/L   ALT 560 (H) 0 - 53 U/L   Alkaline Phosphatase 57 39 - 117 U/L   Total Bilirubin 0.3 0.3 - 1.2 mg/dL    GFR calc non Af Amer 33 (L) >90 mL/min   GFR calc Af Amer 38 (L) >90 mL/min    Comment: (NOTE) The eGFR has been calculated using the CKD EPI equation. This calculation has not been validated in all clinical situations. eGFR's persistently <90 mL/min signify possible Chronic Kidney Disease.    Anion gap 23 (H) 5 - 15  POC occult blood, ED     Status: Abnormal   Collection Time: 01/30/2015  2:52 PM  Result Value Ref Range   Fecal Occult Bld POSITIVE (A) NEGATIVE  I-Stat Chem 8, ED     Status: Abnormal   Collection Time: 02/11/2015  2:53 PM  Result Value Ref Range   Sodium 147 (H) 135 - 145 mmol/L   Potassium 4.7 3.5 - 5.1 mmol/L   Chloride 113 (H) 96 - 112 mmol/L   BUN 37 (H) 6 - 23 mg/dL   Creatinine, Ser 1.80 (H) 0.50 - 1.35 mg/dL   Glucose, Bld 344 (H) 70 - 99 mg/dL   Calcium, Ion 1.10 (L) 1.13 - 1.30 mmol/L   TCO2 9 0 - 100 mmol/L   Hemoglobin 5.4 (LL) 13.0 - 17.0 g/dL   HCT 16.0 (L) 39.0 - 52.0 %   Comment NOTIFIED PHYSICIAN   Initiate MTP (Blood Bank Notification)     Status: None   Collection Time: 02/23/2015  2:57 PM  Result Value Ref Range   Initiate Massive Transfusion Protocol      VERBAL ORDER CONFIRMED MTP ACTIVATED ORDER PUT IN BY ED, DR Ander Purpura 02/05/2015  1428  I-Stat Troponin, ED (not at [facility])     Status: None   Collection Time: 01/27/2015  3:19 PM  Result Value Ref Range   Troponin i, poc 0.07 0.00 - 0.08 ng/mL   Comment 3            Comment: Due to the release kinetics of cTnI, a negative result within the first hours of the onset of symptoms does not rule out myocardial infarction with certainty. If myocardial infarction is still suspected, repeat the test at appropriate intervals.   I-Stat CG4 Lactic Acid, ED     Status: Abnormal   Collection Time: 02/02/2015  3:22 PM  Result Value Ref Range   Lactic Acid, Venous >17.00 (HH) 0.5 - 2.0 mmol/L  I-Stat arterial blood gas, ED     Status: Abnormal   Collection Time: 02/01/2015  3:32 PM  Result Value Ref Range    pH, Arterial 6.750 (LL) 7.350 - 7.450   pCO2 arterial 53.2 (H) 35.0 - 45.0 mmHg   pO2, Arterial 239.0 (H) 80.0 - 100.0 mmHg   Bicarbonate 7.4 (L) 20.0 - 24.0 mEq/L   TCO2 9 0 - 100 mmol/L   O2 Saturation 99.0 %   Acid-base deficit 27.0 (H) 0.0 - 2.0 mmol/L   Patient temperature 98.0 F    Collection site ARTERIAL LINE    Drawn by RT    Sample type ARTERIAL    Comment NOTIFIED PHYSICIAN   Prepare fresh frozen plasma     Status: None (Preliminary result)   Collection Time: 02/12/2015  4:55 PM  Result Value Ref Range   Unit Number [id number]    Blood Component Type THAWED PLASMA    Unit division 00    Status of Unit ISSUED    Transfusion Status OK TO TRANSFUSE    Unit Number [id number]    Blood Component Type THW PLS APHR    Unit division 00    Status of Unit ISSUED    Transfusion Status OK TO TRANSFUSE    Unit Number [id number]    Blood Component Type THAWED PLASMA    Unit division 00    Status of Unit ISSUED    Transfusion Status OK TO TRANSFUSE    Unit Number [id number]    Blood Component Type THAWED PLASMA    Unit division 00    Status of Unit ISSUED    Transfusion Status OK TO TRANSFUSE    Unit Number [id number]    Blood Component Type THAWED PLASMA    Unit division 00    Status of Unit ALLOCATED    Transfusion Status OK TO TRANSFUSE   Prepare Pheresed Platelets     Status: None (Preliminary result)   Collection Time: 02/15/2015  4:56 PM  Result Value Ref Range   Unit Number [id number]    Blood Component Type PLTPHER LR1    Unit division 00    Status of Unit ISSUED    Transfusion Status OK TO TRANSFUSE    Unit Number [id number]    Blood Component Type PLTP LR1 PAS    Unit division 00    Status of Unit ISSUED    Transfusion Status OK TO TRANSFUSE   Prepare RBC     Status: None   Collection Time: 02/23/2015  5:28 PM  Result Value Ref Range   Order Confirmation ORDER PROCESSED BY BLOOD BANK   I-STAT, chem 8     Status: Abnormal   Collection Time:  02/18/2015  5:32 PM  Result  Value Ref Range   Sodium 141 135 - 145 mmol/L   Potassium 5.7 (H) 3.5 - 5.1 mmol/L   Chloride 109 96 - 112 mmol/L   BUN 47 (H) 6 - 23 mg/dL   Creatinine, Ser 1.60 (H) 0.50 - 1.35 mg/dL   Glucose, Bld 369 (H) 70 - 99 mg/dL   Calcium, Ion 1.04 (L) 1.13 - 1.30 mmol/L   TCO2 12 0 - 100 mmol/L   Hemoglobin 10.9 (L) 13.0 - 17.0 g/dL   HCT 32.0 (L) 39.0 - 52.0 %  CG4 I-STAT (Lactic acid)     Status: Abnormal   Collection Time: 02/20/2015  5:33 PM  Result Value Ref Range   Lactic Acid, Venous 14.90 (HH) 0.5 - 2.0 mmol/L   Comment NOTIFIED PHYSICIAN    Dg Chest Port 1 View  02/19/2015   CLINICAL DATA:  Endotracheal tube.  CPR in progress.  EXAM: PORTABLE CHEST - 1 VIEW  COMPARISON:  02/21/2015  FINDINGS: Endotracheal tube tip between the clavicular heads and carina. Gastric suction tube enters the stomach at least.  Stable cardiopericardial enlargement. There is subcutaneous gas in the left chest wall. Small left-sided basilar pneumothorax. Suspect left rib fractures, presumably from CPR.  The right lung is low volume but otherwise clear.  Stable cardiomegaly post CABG.  Critical Value/emergent results were called by telephone at the time of interpretation on 02/06/2015 at 5:36 pm to Dr. Brand Males , who verbally acknowledged these results.  IMPRESSION: 1. Left-sided air leak with chest wall gas and small basilar pneumothorax. 2. Probable left rib fractures. 3. Endotracheal and orogastric tubes are in good position. 4. Low lung volumes.   Electronically Signed   By: Monte Fantasia M.D.   On: 02/07/2015 17:45   Dg Chest Portable 1 View  02/06/2015   CLINICAL DATA:  Status post CPR, hypovolemia  EXAM: PORTABLE CHEST - 1 VIEW  COMPARISON:  11/16/2009  FINDINGS: Endotracheal tube is noted 3.2 cm above the carina. A E in external metallic artifact is noted postsurgical changes are again seen. The cardiac shadow remains enlarged. The overall inspiratory effort is poor with crowding  of the vascular markings. No focal confluent infiltrate is seen.  IMPRESSION: Poor inspiratory effort.  Endotracheal tube in satisfactory position.   Electronically Signed   By: Inez Catalina M.D.   On: 02/09/2015 16:41   Review of Systems  Unable to perform ROS  Blood pressure 103/60, pulse 110, temperature 94.6 F (34.8 C), temperature source Core (Comment), resp. rate 30, height 6' 3"  (1.905 m), SpO2 92 %. Physical Exam  Constitutional: He appears well-developed and well-nourished.  Morbidly obese white male, intubated and on pressors  HENT:  Head: Normocephalic and atraumatic.  GI: Soft. He exhibits distension. There is no tenderness. There is no rebound and no guarding.  Skin: Skin is warm and dry.   Assessment/Plan: Massive GI hemorrhage s/p cardiac arrest with lactic acidosis, acute respiratory failure and circulatory shock: proceed with an EGD at this time.   Byron Tipping 02/17/2015, 6:24 PM

## 2015-01-28 NOTE — Progress Notes (Signed)
Increased VT from 500 to 670 based on pt's heigh and IBW once able to measure pt.

## 2015-01-28 NOTE — ED Notes (Signed)
Unit # [id number]

## 2015-01-28 NOTE — ED Notes (Signed)
NOTIFIED DR .Theodoro KosSTINEL OF PATIENTS LAB RESULTS OF CG4+LACTIC ACID @15 :30 PM .

## 2015-01-28 NOTE — Code Documentation (Signed)
No pulse.  Continue CPR.

## 2015-01-28 NOTE — ED Notes (Signed)
#  8 PRBC [id number]

## 2015-01-28 NOTE — ED Notes (Signed)
Pulses lost.  CPR resumed.  Dr Marchelle Gearingamaswamy at bedside.  No OR available.

## 2015-01-28 NOTE — Transfer of Care (Signed)
Immediate Anesthesia Transfer of Care Note  Patient: Gary [name]  Procedure(s) Performed: Procedure(s): EXPLORATORY LAPAROTOMY (N/A)  Patient Location: SICU  Anesthesia Type:General  Level of Consciousness: Patient remains intubated per anesthesia plan  Airway & Oxygen Therapy: Patient remains intubated per anesthesia plan and Patient placed on Ventilator (see vital sign flow sheet for setting)  Post-op Assessment: Report given to RN and vital signs reviewed and unstable, anesthesiologist and SICU MD present  Post vital signs: Reviewed and unstable  Last Vitals:  Filed Vitals:   08-02-15 1743  BP: 103/60  Pulse: 110  Temp:   Resp: 30    Complications: No apparent anesthesia complications

## 2015-01-28 NOTE — ED Notes (Signed)
#   5 unit of O neg [id number] 16 [id number]

## 2015-01-28 NOTE — ED Notes (Signed)
#  7 Unit of O neg blood [id number]

## 2015-01-28 NOTE — ED Notes (Signed)
CPR resumed. No pulses noted.  Pressures dropped.

## 2015-01-28 NOTE — ED Notes (Signed)
Pt beginning to attempt to pull out et tube.  Able to look to staff when name is called.  Remains hypotensive.

## 2015-01-28 NOTE — ED Notes (Signed)
Color improving.  

## 2015-01-28 NOTE — Progress Notes (Signed)
PATIENT NAME: Gary [name] Seeney MEDICAL RECORD NUMBER: [id number] Birthday: July 02, 1951  Age: 64 y.o. Admit Date: 02/14/2015  Provider: Kalman ShanMurali Zakiya Sporrer  Indication: PEA arrest  Technical Description:   CPR performance duration: 10-20 minutes  Was defibrillation or cardioversion used ? no  Was external pacer placed ? no  Was patient intubated pre/post CPR ? pre  Was transvenous pacer placed ? no  Medications Administered Include      Yes/no Amiodarone no  Atropin no  Calcium X 1  Epinephrine X 2-3  Lidocaine no  Magnesium x1  Norepinephrine Ongoing infusion  Phenylephrine Ongoing infusion  Sodium bicarbonate ongouing infusion  Vasopression ongong infusion + vasopressin x 40 IU bolus   Evaluation  Final Status - Was patient successfully resuscitated ?  yes  If successfully resuscitated - what is current rhythm ? sinus If successfully resuscitated - what is current hemodynamic status ? ongoiung circulatory shock  Miscellaneous Information Left PTx called by radiology on cXR  Pre-cpr but this is not appreciated by bedside US eval , CXR review by CCS MD and CCM MD. OPatient needs to go stat to OR for gi bleed. Will fu CXR post surgery   Emergent surgery - high risk for death    Dr. Kalman ShanMurali Ebert Forrester, [name].D., [facility] HospitalF.C.C.P Pulmonary and Critical Care Medicine Staff Physician [facility] Pager: [phone], If no answer or between  15:00h - 7:00h: call [phone]  02/03/2015 5:49 PM

## 2015-01-28 NOTE — Code Documentation (Signed)
Response of spontaneous pulses.

## 2015-01-28 NOTE — ED Notes (Signed)
Carotid pulse palpated. 

## 2015-01-28 NOTE — ED Notes (Signed)
Spoke with BB, BB aware of massive transfusion protocol in place

## 2015-01-28 NOTE — Anesthesia Preprocedure Evaluation (Addendum)
Anesthesia Evaluation  Patient identified by MRN, date of birth, ID bandPreop documentation limited or incomplete due to emergent nature of procedure.  Airway        Dental   Pulmonary          Cardiovascular     Neuro/Psych    GI/Hepatic   Endo/Other    Renal/GU      Musculoskeletal   Abdominal   Peds  Hematology   Anesthesia Other Findings Pt received from ER intubated, in critical condition  Reproductive/Obstetrics                             Anesthesia Physical Anesthesia Plan  ASA: IV and emergent  Anesthesia Plan: General   Post-op Pain Management:    Induction:   Airway Management Planned: Oral ETT  Additional Equipment:   Intra-op Plan:   Post-operative Plan: Post-operative intubation/ventilation  Informed Consent: I have reviewed the patients History and Physical, chart, labs and discussed the procedure including the risks, benefits and alternatives for the proposed anesthesia with the patient or authorized representative who has indicated his/her understanding and acceptance.     Plan Discussed with: CRNA and Anesthesiologist  Anesthesia Plan Comments:         Anesthesia Quick Evaluation

## 2015-01-28 NOTE — Procedures (Signed)
Chest Tube Insertion Procedure Note  Indications:  Shock, suspected tension pneumothorax  Pre-operative Diagnosis: Pneumothorax  Post-operative Diagnosis: Shock without evidence of tension pneumothorax or hemothorax  Procedure Details  Procedure was performed on an emergent basis based on refractory shock and prior CXR concerning for L PTX.   After sterile skin prep, using standard technique, a 20 French tube was placed in the left lateral 8th rib space.  Findings: No air rush. No hemothorax. F/U CXR revealed chest tube in pleural space. No PTX.   Estimated Blood Loss:  Minimal         Specimens:  None              Complications:  None; patient tolerated the procedure well.         Disposition: ICU - intubated and critically ill.         Condition: stable  Attending Attestation: I performed the procedure.  Billy Fischeravid Blanche Scovell, MD ; [facility] Mobile [phone].  After 5:30 PM or weekends, call [phone]

## 2015-01-28 NOTE — Significant Event (Signed)
The pt returned to ICU from OR with refractory hemorrhagic shock and profoundly coagulopathic. He is comatose. L hemithorax reveals swelling, subQ gas. His abdomen is massively distended with copious bleeding from the wound vac. He has received > 20 units PRBCs as well as multiple units of FFP and platelets. He is on maximum doses of vasopressin, norepi and epinephrine infusions. I have spoken with 2 sons and their 2 SO's, one of whom is a physician. They are unanimous in their belief that he would not want further heroic measures in light of the very poor prognosis. In fact, they had just had this conversation with him last week where he clearly expressed that he would not wish to undergo life support, etc if the prognosis for survival was slim.  IMPRESSION VDRF, post op Massive UGIB S/P laparotomy Refractory hemorrhagic shock Severe coagulopathy   I have established the following limitations: 1) no ACLS/CPR when cardiac arrest ensues 2) no further titration of vasopressors 3) no further blood products  We will try to sustain him until [place]  CCM X 45 mins   Billy Fischeravid Simonds, MD ; [facility] [phone].  After 5:30 PM or weekends, call [phone]

## 2015-01-28 NOTE — ED Notes (Signed)
NOTIFIED DR. Denton LankSTEINL OF PATIENTS LAB RESULTS OF CHEM8+ ,

## 2015-01-28 NOTE — Anesthesia Postprocedure Evaluation (Signed)
  Anesthesia Post-op Note  Patient: Gary [name]  Procedure(s) Performed: Procedure(s): EXPLORATORY LAPAROTOMY (N/A)  Patient Location: ICU  Anesthesia Type:General  Level of Consciousness: Patient remains intubated per anesthesia plan  Airway and Oxygen Therapy: Patient remains intubated per anesthesia plan  Post-op Pain: none  Post-op Assessment: PATIENT'S CARDIOVASCULAR STATUS UNSTABLE  Post-op Vital Signs: unstable  Last Vitals:  Filed Vitals:   02/20/2015 2030  BP:   Pulse: 96  Temp:   Resp: 24    Complications: No apparent anesthesia complications

## 2015-01-28 NOTE — Op Note (Signed)
02/22/2015  7:52 PM  PATIENT:  Gary [name]  64 y.o. male  PRE-OPERATIVE DIAGNOSIS:  GI BLEED  POST-OPERATIVE DIAGNOSIS:  GI BLEED  PROCEDURE:  Procedure(s): EXPLORATORY LAPAROTOMY, gastrotomy, oversewing of gastric ulcer, placement of abdominal VAC (N/A)  SURGEON:  Surgeon(s) and Role:    * Axel Filler, MD - Primary    * Harriette Bouillon, MD - Assisting  ANESTHESIA:   general  EBL: From the procedure approximately 250 mL of blood, the patient had approximately 1-2 L of blood loss within the stomach. Total I/O In: 1605 [Blood:1355; IV Piggyback:250] Out: -   BLOOD ADMINISTERED: As per anesthesia records  DRAINS: none   LOCAL MEDICATIONS USED:  NONE  SPECIMEN:  No Specimen  DISPOSITION OF SPECIMEN:  N/A  COUNTS:  NO, missing laparotomy sponge and ACTION TAKEN: X-RAY(S) TAKEN With retainment of laparotomy sponge within abdomen.  TOURNIQUET:  * No tourniquets in log *  DICTATION: .Dragon Dictation  The patient was emergently consented and taken back to the operating room. He is placed in the supine position with bilateral SCDs in place. He was prepped and draped in the usual sterile fashion.  A 10 blade was used to make an upper midline incision. Sharp dissection was taken down to the anterior fascia. The fascia was sharply incised. This was lengthened to the length of the skin incision. Curved Mayo scissors were used to open up the peritoneum. At this time a self retaining retractor was placed into the wound. The colon was initially seen and could be seen. Blood. The omentum was draped over the stomach, and a LigaSure device was used to remove the omentum from the anterior portion of the stomach. A 4 x 4 laparotomy pads were used to pack the omentum caudad. At this time this allowed [place] to retract the stomach caudad. There was a large amount of blood within the stomach. The stomach was grasped with Babcock clamps. The stomach was incised using cautery. There was a large  amount of blood that was evacuated from the stomach. This was approximately 1-2 L of blood. We initially began to examine the antrum of the stomach to examine for bleeding source. Malleable retractors were placed to help with visualization. There was no bleeding that could be seen.   We then evacuated the blood from the proximal portion of the stomach.  Again malleable retractors were used to visualize the anterior and posterior walls of the stomach. Upon visualizing the anterior portion of the stomach we were able to visualize a bleeding ulcer. 2-0 figure-of-eight stitches were placed 3. This appeared to obtain hemostasis of the bleeding ulcer.  There was a large amount of rundown from the surface and cut edges of the stomach. We were unable to identify a second source of bleeding. At this time we placed Gelfoam within the stomach secondary to the oozing from the mucosa of the stomach. 2 sheets of snow were also placed within the stomach.  Secondary to the patient's ongoing coagulopathy and patient's core temperature at this time we proceeded to close the gastrotomy. Prior to closing the gastrotomy NG tube was placed at the gastrotomy closure site. This was done with a 2-0 silk in a running locking fashion.    An abdominal VAC was then placed into the abdomen with a corresponding blue sponges. The drapes were placed over the blue sponges. Suction was obtained and there was no leak.  Secondary to a miscount a KUB was taken. This identified a laparotomy sponges in  the abdomen.  The patient was taken to the ICU intubated and in critical condition.  PLAN OF CARE: Admit to inpatient   PATIENT DISPOSITION:  ICU - intubated and critically ill.   Delay start of Pharmacological VTE agent (>24hrs) due to surgical blood loss or risk of bleeding: not applicable

## 2015-01-28 NOTE — Code Documentation (Addendum)
No pulse after epi, though cardiac movement on cardiac US.  CPR resumed.

## 2015-01-28 NOTE — Code Documentation (Signed)
Faint pulse noted. 

## 2015-01-28 NOTE — Consult Note (Signed)
Reason for Consult:Massive UGIB Referring Physician: Dr. Ruthe Mannan Gary [name] is an 64 y.o. male.  HPI: The history was obtained via ER, GI, and critical care medicine physicians.  The patient was initially received to the ER with CPR in progress. Patient appeared to have a GI bleed. Secondary to a GI bleed GI was consult. Within that time, the patient again lost a pulse and CPR was administered.  GI ultimately performed EGD which revealed a large amount of blood within the stomach and per speaking with Dr. Collene Mares patient appeared to have a bleeding ulcer at the antrum. She stated that there appeared to be no duodenal bleeding that could be seen. She states no varices were seen. The patient was transfuse per massive transfusion protocol, specific transfusion records are per ER nursing notes.  Surgery was consulted after EGD revealed gastric bleed. We will consult approximate 4 hours after the patient's arrival.  In the process of speaking with the operating room to get a room ready the patient again lost the pulse and CPR was initiated.  The patient did regain a pulse. At that time the patient was taken to the operating room for exploratory laparotomy.  History reviewed. No pertinent past medical history.  History reviewed. No pertinent past surgical history.  History reviewed. No pertinent family history.  Social History:  has no tobacco, alcohol, and drug history on file.  Allergies:  Allergies  Allergen Reactions  . Sulfonamide Derivatives     Medications: I have reviewed the patient's current medications.  Results for orders placed or performed during the hospital encounter of 02/21/2015 (from the past 48 hour(s))  Type and screen     Status: None (Preliminary result)   Collection Time: 02/15/2015  2:18 PM  Result Value Ref Range   ABO/RH(D) B POS    Antibody Screen NEG    Sample Expiration 01/31/2015    Unit Number [id number]    Blood Component Type RED CELLS,LR    Unit  division 00    Status of Unit ISSUED    Unit tag comment VERBAL ORDERS PER DR STEINL    Transfusion Status OK TO TRANSFUSE    Crossmatch Result COMPATIBLE    Donor AG Type NEGATIVE FOR E ANTIGEN    Unit Number [id number]    Blood Component Type RED CELLS,LR    Unit division 00    Status of Unit ISSUED    Unit tag comment VERBAL ORDERS PER DR STEINL    Transfusion Status OK TO TRANSFUSE    Crossmatch Result COMPATIBLE    Donor AG Type NEGATIVE FOR E ANTIGEN    Unit Number [id number]    Blood Component Type RED CELLS,LR    Unit division 00    Status of Unit ISSUED    Unit tag comment VERBAL ORDERS PER DR STEINL    Transfusion Status OK TO TRANSFUSE    Crossmatch Result COMPATIBLE    Donor AG Type NEGATIVE FOR E ANTIGEN    Unit Number [id number]    Blood Component Type RED CELLS,LR    Unit division 00    Status of Unit ISSUED    Unit tag comment VERBAL ORDERS PER DR STEINL    Transfusion Status OK TO TRANSFUSE    Crossmatch Result COMPATIBLE    Donor AG Type NEGATIVE FOR E ANTIGEN    Unit Number [id number]    Blood Component Type RBC LR PHER2    Unit division 00    Status of Unit REL  FROM [facility]    Unit tag comment VERBAL ORDERS PER DR STEINL    Transfusion Status OK TO TRANSFUSE    Crossmatch Result PENDING    Unit Number [id number]    Blood Component Type RBC LR PHER2    Unit division 00    Status of Unit REL FROM [facility]    Unit tag comment VERBAL ORDERS PER DR STEINL    Transfusion Status OK TO TRANSFUSE    Crossmatch Result PENDING    Unit Number [id number]    Blood Component Type RED CELLS,LR    Unit division 00    Status of Unit REL FROM [facility]    Unit tag comment VERBAL ORDERS PER DR STEINL    Transfusion Status OK TO TRANSFUSE    Crossmatch Result PENDING    Unit Number [id number]    Blood Component Type RED CELLS,LR    Unit division 00    Status of Unit REL FROM [facility]    Unit tag comment VERBAL ORDERS PER DR STEINL    Transfusion Status  OK TO TRANSFUSE    Crossmatch Result PENDING    Unit Number [id number]    Blood Component Type RBC LR PHER1    Unit division 00    Status of Unit ISSUED    Unit tag comment VERBAL ORDERS PER DR STEINL    Transfusion Status OK TO TRANSFUSE    Crossmatch Result COMPATIBLE    Unit Number [id number]    Blood Component Type RBC LR PHER1    Unit division 00    Status of Unit ISSUED    Unit tag comment VERBAL ORDERS PER DR STEINL    Transfusion Status OK TO TRANSFUSE    Crossmatch Result COMPATIBLE    Unit Number [id number]    Blood Component Type RED CELLS,LR    Unit division 00    Status of Unit ISSUED    Unit tag comment VERBAL ORDERS PER DR STEINL    Transfusion Status OK TO TRANSFUSE    Crossmatch Result COMPATIBLE    Unit Number [id number]    Blood Component Type RED CELLS,LR    Unit division 00    Status of Unit ISSUED    Unit tag comment VERBAL ORDERS PER DR STEINL    Transfusion Status OK TO TRANSFUSE    Crossmatch Result COMPATIBLE    Unit Number [id number]    Blood Component Type RED CELLS,LR    Unit division 00    Status of Unit ALLOCATED    Unit tag comment VERBAL ORDERS PER DR Maijor Hornig    Transfusion Status OK TO TRANSFUSE    Crossmatch Result PENDING    Unit Number [id number]    Blood Component Type RED CELLS,LR    Unit division 00    Status of Unit ALLOCATED    Unit tag comment VERBAL ORDERS PER DR Ordell Prichett    Transfusion Status OK TO TRANSFUSE    Crossmatch Result PENDING    Unit Number [id number]    Blood Component Type RED CELLS,LR    Unit division 00    Status of Unit REL FROM [facility]    Unit tag comment VERBAL ORDERS PER DR STEINL    Transfusion Status OK TO TRANSFUSE    Crossmatch Result PENDING    Unit Number [id number]    Blood Component Type RED CELLS,LR    Unit division 00    Status of Unit ISSUED    Unit tag comment VERBAL ORDERS PER DR Rosendo Gros  Transfusion Status OK TO TRANSFUSE    Crossmatch Result PENDING    Unit  Number [id number]    Blood Component Type RED CELLS,LR    Unit division 00    Status of Unit ISSUED    Donor AG Type NEGATIVE FOR E ANTIGEN    Unit tag comment VERBAL ORDERS PER DR RAMIREZ    Transfusion Status OK TO TRANSFUSE    Crossmatch Result PENDING    Unit Number [id number]    Blood Component Type RED CELLS,LR    Unit division 00    Status of Unit ISSUED    Donor AG Type NEGATIVE FOR E ANTIGEN    Unit tag comment VERBAL ORDERS PER DR RAMIREZ    Transfusion Status OK TO TRANSFUSE    Crossmatch Result PENDING    Unit Number [id number]    Blood Component Type RED CELLS,LR    Unit division 00    Status of Unit ISSUED    Donor AG Type NEGATIVE FOR E ANTIGEN    Unit tag comment VERBAL ORDERS PER DR RAMIREZ    Transfusion Status OK TO TRANSFUSE    Crossmatch Result PENDING    Unit Number [id number]    Blood Component Type RED CELLS,LR    Unit division 00    Status of Unit ISSUED    Donor AG Type NEGATIVE FOR E ANTIGEN    Unit tag comment VERBAL ORDERS PER DR RAMIREZ    Transfusion Status OK TO TRANSFUSE    Crossmatch Result PENDING    Unit Number [id number]    Blood Component Type RED CELLS,LR    Unit division 00    Status of Unit ISSUED    Unit tag comment VERBAL ORDERS PER DR RAMIREZ    Transfusion Status OK TO TRANSFUSE    Crossmatch Result PENDING    Unit Number [id number]    Blood Component Type RED CELLS,LR    Unit division 00    Status of Unit ISSUED    Unit tag comment VERBAL ORDERS PER DR RAMIREZ    Transfusion Status OK TO TRANSFUSE    Crossmatch Result PENDING    Unit Number [id number]    Blood Component Type RED CELLS,LR    Unit division 00    Status of Unit ISSUED    Unit tag comment VERBAL ORDERS PER DR RAMIREZ    Transfusion Status OK TO TRANSFUSE    Crossmatch Result PENDING    Unit Number [id number]    Blood Component Type RED CELLS,LR    Unit division 00    Status of Unit ISSUED    Unit tag comment VERBAL ORDERS PER DR  RAMIREZ    Transfusion Status OK TO TRANSFUSE    Crossmatch Result PENDING    Unit Number [id number]    Blood Component Type RED CELLS,LR    Unit division 00    Status of Unit ISSUED    Unit tag comment VERBAL ORDERS PER DR RAMIREZ    Transfusion Status OK TO TRANSFUSE    Crossmatch Result PENDING    Unit Number [id number]    Blood Component Type RBC LR PHER1    Unit division 00    Status of Unit ALLOCATED    Unit tag comment VERBAL ORDERS PER DR RAMIREZ    Transfusion Status OK TO TRANSFUSE    Crossmatch Result PENDING    Unit Number [id number]    Blood Component Type RED CELLS,LR    Unit division 00    Status of Unit  ALLOCATED    Unit tag comment VERBAL ORDERS PER DR RAMIREZ    Transfusion Status OK TO TRANSFUSE    Crossmatch Result PENDING   Prepare fresh frozen plasma     Status: None   Collection Time: 02/09/2015  2:18 PM  Result Value Ref Range   Unit Number [id number]    Blood Component Type LIQ PLASMA    Unit division 00    Status of Unit REL FROM [facility]    Unit tag comment VERBAL ORDERS PER DR STEINL    Transfusion Status OK TO TRANSFUSE    Unit Number [id number]    Blood Component Type LIQ PLASMA    Unit division 00    Status of Unit REL FROM [facility]    Unit tag comment VERBAL ORDERS PER DR STEINL    Transfusion Status OK TO TRANSFUSE   CBC     Status: Abnormal   Collection Time: 02/04/2015  2:38 PM  Result Value Ref Range   WBC 9.9 4.0 - 10.5 K/uL    Comment: WHITE COUNT CONFIRMED ON SMEAR ADJUSTED FOR NUCLEATED RBC'S    RBC 1.92 (L) 4.22 - 5.81 MIL/uL   Hemoglobin 5.7 (LL) 13.0 - 17.0 g/dL    Comment: REPEATED TO VERIFY SPECIMEN CHECKED FOR CLOTS CRITICAL RESULT CALLED TO, READ BACK BY AND VERIFIED WITH: B MICHAEL,RN 1533 02/16/2015 WBOND    HCT 19.2 (L) 39.0 - 52.0 %   MCV 100.0 78.0 - 100.0 fL   MCH 29.7 26.0 - 34.0 pg   MCHC 29.7 (L) 30.0 - 36.0 g/dL   RDW 15.4 11.5 - 15.5 %   Platelets 138 (L) 150 - 400 K/uL  Protime-INR (if patient is  taking Coumadin)     Status: Abnormal   Collection Time: 02/15/2015  2:38 PM  Result Value Ref Range   Prothrombin Time 20.9 (H) 11.6 - 15.2 seconds   INR 1.78 (H) 0.00 - 1.49  Comprehensive metabolic panel     Status: Abnormal   Collection Time: 02/06/2015  2:38 PM  Result Value Ref Range   Sodium 149 (H) 135 - 145 mmol/L   Potassium 4.8 3.5 - 5.1 mmol/L   Chloride 114 (H) 96 - 112 mmol/L   CO2 12 (L) 19 - 32 mmol/L   Glucose, Bld 358 (H) 70 - 99 mg/dL   BUN 33 (H) 6 - 23 mg/dL   Creatinine, Ser 2.05 (H) 0.50 - 1.35 mg/dL   Calcium 8.7 8.4 - 10.5 mg/dL   Total Protein 4.1 (L) 6.0 - 8.3 g/dL   Albumin 2.0 (L) 3.5 - 5.2 g/dL   AST 652 (H) 0 - 37 U/L   ALT 560 (H) 0 - 53 U/L   Alkaline Phosphatase 57 39 - 117 U/L   Total Bilirubin 0.3 0.3 - 1.2 mg/dL   GFR calc non Af Amer 33 (L) >90 mL/min   GFR calc Af Amer 38 (L) >90 mL/min    Comment: (NOTE) The eGFR has been calculated using the CKD EPI equation. This calculation has not been validated in all clinical situations. eGFR's persistently <90 mL/min signify possible Chronic Kidney Disease.    Anion gap 23 (H) 5 - 15  POC occult blood, ED     Status: Abnormal   Collection Time: 02/09/2015  2:52 PM  Result Value Ref Range   Fecal Occult Bld POSITIVE (A) NEGATIVE  I-Stat Chem 8, ED     Status: Abnormal   Collection Time: 02/18/2015  2:53 PM  Result Value  Ref Range   Sodium 147 (H) 135 - 145 mmol/L   Potassium 4.7 3.5 - 5.1 mmol/L   Chloride 113 (H) 96 - 112 mmol/L   BUN 37 (H) 6 - 23 mg/dL   Creatinine, Ser 1.80 (H) 0.50 - 1.35 mg/dL   Glucose, Bld 344 (H) 70 - 99 mg/dL   Calcium, Ion 1.10 (L) 1.13 - 1.30 mmol/L   TCO2 9 0 - 100 mmol/L   Hemoglobin 5.4 (LL) 13.0 - 17.0 g/dL   HCT 16.0 (L) 39.0 - 52.0 %   Comment NOTIFIED PHYSICIAN   Initiate MTP (Blood Bank Notification)     Status: None   Collection Time: 01/26/2015  2:57 PM  Result Value Ref Range   Initiate Massive Transfusion Protocol      VERBAL ORDER CONFIRMED MTP ACTIVATED  ORDER PUT IN BY ED, DR Ander Purpura 02/03/2015 1428  I-Stat Troponin, ED (not at [facility])     Status: None   Collection Time: 02/05/2015  3:19 PM  Result Value Ref Range   Troponin i, poc 0.07 0.00 - 0.08 ng/mL   Comment 3            Comment: Due to the release kinetics of cTnI, a negative result within the first hours of the onset of symptoms does not rule out myocardial infarction with certainty. If myocardial infarction is still suspected, repeat the test at appropriate intervals.   I-Stat CG4 Lactic Acid, ED     Status: Abnormal   Collection Time: 02/04/2015  3:22 PM  Result Value Ref Range   Lactic Acid, Venous >17.00 (HH) 0.5 - 2.0 mmol/L  I-Stat arterial blood gas, ED     Status: Abnormal   Collection Time: 02/19/2015  3:32 PM  Result Value Ref Range   pH, Arterial 6.750 (LL) 7.350 - 7.450   pCO2 arterial 53.2 (H) 35.0 - 45.0 mmHg   pO2, Arterial 239.0 (H) 80.0 - 100.0 mmHg   Bicarbonate 7.4 (L) 20.0 - 24.0 mEq/L   TCO2 9 0 - 100 mmol/L   O2 Saturation 99.0 %   Acid-base deficit 27.0 (H) 0.0 - 2.0 mmol/L   Patient temperature 98.0 F    Collection site ARTERIAL LINE    Drawn by RT    Sample type ARTERIAL    Comment NOTIFIED PHYSICIAN   Prepare fresh frozen plasma     Status: None (Preliminary result)   Collection Time: 01/25/2015  4:55 PM  Result Value Ref Range   Unit Number [id number]    Blood Component Type THAWED PLASMA    Unit division 00    Status of Unit ISSUED    Transfusion Status OK TO TRANSFUSE    Unit Number [id number]    Blood Component Type THW PLS APHR    Unit division 00    Status of Unit ISSUED    Transfusion Status OK TO TRANSFUSE    Unit Number [id number]    Blood Component Type THAWED PLASMA    Unit division 00    Status of Unit ISSUED    Transfusion Status OK TO TRANSFUSE    Unit Number [id number]    Blood Component Type THAWED PLASMA    Unit division 00    Status of Unit ISSUED    Transfusion Status OK TO TRANSFUSE    Unit Number  [id number]    Blood Component Type THAWED PLASMA    Unit division 00    Status of Unit ISSUED    Transfusion Status OK TO TRANSFUSE  Unit Number [id number]    Blood Component Type THW PLS APHR    Unit division 00    Status of Unit ISSUED    Transfusion Status OK TO TRANSFUSE    Unit Number [id number]    Blood Component Type THAWED PLASMA    Unit division 00    Status of Unit ISSUED    Transfusion Status OK TO TRANSFUSE    Unit Number [id number]    Blood Component Type THAWED PLASMA    Unit division 00    Status of Unit ISSUED    Transfusion Status OK TO TRANSFUSE    Unit Number [id number]    Blood Component Type THAWED PLASMA    Unit division 00    Status of Unit ISSUED    Transfusion Status OK TO TRANSFUSE    Unit Number [id number]    Blood Component Type THAWED PLASMA    Unit division 00    Status of Unit ISSUED    Transfusion Status OK TO TRANSFUSE    Unit Number [id number]    Blood Component Type LIQ PLASMA    Unit division 00    Status of Unit ISSUED    Transfusion Status OK TO TRANSFUSE    Unit Number [id number]    Blood Component Type LIQ PLASMA    Unit division 00    Status of Unit ISSUED    Transfusion Status OK TO TRANSFUSE    Unit Number [id number]    Blood Component Type LIQ PLASMA    Unit division 00    Status of Unit ISSUED    Transfusion Status OK TO TRANSFUSE    Unit Number [id number]    Blood Component Type LIQ PLASMA    Unit division 00    Status of Unit ISSUED    Transfusion Status OK TO TRANSFUSE    Unit Number [id number]    Blood Component Type THW PLS APHR    Unit division 00    Status of Unit ALLOCATED    Transfusion Status OK TO TRANSFUSE    Unit Number [id number]    Blood Component Type THAWED PLASMA    Unit division 00    Status of Unit ALLOCATED    Transfusion Status OK TO TRANSFUSE    Unit Number [id number]    Blood Component Type THAWED PLASMA    Unit division 00    Status of Unit  ALLOCATED    Transfusion Status OK TO TRANSFUSE    Unit Number [id number]    Blood Component Type THAWED PLASMA    Unit division 00    Status of Unit ALLOCATED    Transfusion Status OK TO TRANSFUSE   Prepare Pheresed Platelets     Status: None (Preliminary result)   Collection Time: 02/17/2015  4:56 PM  Result Value Ref Range   Unit Number [id number]    Blood Component Type PLTPHER LR1    Unit division 00    Status of Unit ISSUED    Transfusion Status OK TO TRANSFUSE    Unit Number [id number]    Blood Component Type PLTP LR1 PAS    Unit division 00    Status of Unit ISSUED    Transfusion Status OK TO TRANSFUSE    Unit Number [id number]    Blood Component Type PLTPHER LR1    Unit division 00    Status of Unit ALLOCATED    Transfusion Status OK TO TRANSFUSE   Prepare RBC     Status: None   Collection Time:  02/13/2015  5:28 PM  Result Value Ref Range   Order Confirmation ORDER PROCESSED BY BLOOD BANK   I-STAT, chem 8     Status: Abnormal   Collection Time: 02/08/2015  5:32 PM  Result Value Ref Range   Sodium 141 135 - 145 mmol/L   Potassium 5.7 (H) 3.5 - 5.1 mmol/L   Chloride 109 96 - 112 mmol/L   BUN 47 (H) 6 - 23 mg/dL   Creatinine, Ser 1.60 (H) 0.50 - 1.35 mg/dL   Glucose, Bld 369 (H) 70 - 99 mg/dL   Calcium, Ion 1.04 (L) 1.13 - 1.30 mmol/L   TCO2 12 0 - 100 mmol/L   Hemoglobin 10.9 (L) 13.0 - 17.0 g/dL   HCT 32.0 (L) 39.0 - 52.0 %  CG4 I-STAT (Lactic acid)     Status: Abnormal   Collection Time: 02/03/2015  5:33 PM  Result Value Ref Range   Lactic Acid, Venous 14.90 (HH) 0.5 - 2.0 mmol/L   Comment NOTIFIED PHYSICIAN   CBC with Differential/Platelet     Status: Abnormal   Collection Time: 02/22/2015  6:35 PM  Result Value Ref Range   WBC 14.1 (H) 4.0 - 10.5 K/uL   RBC 3.13 (L) 4.22 - 5.81 MIL/uL   Hemoglobin 9.4 (L) 13.0 - 17.0 g/dL   HCT 29.1 (L) 39.0 - 52.0 %   MCV 93.0 78.0 - 100.0 fL    Comment: REPEATED TO VERIFY POST TRANSFUSION SPECIMEN    MCH 30.0  26.0 - 34.0 pg   MCHC 32.3 30.0 - 36.0 g/dL   RDW 14.8 11.5 - 15.5 %   Platelets 102 (L) 150 - 400 K/uL    Comment: PLATELET COUNT CONFIRMED BY SMEAR REPEATED TO VERIFY SPECIMEN CHECKED FOR CLOTS DELTA CHECK NOTED    Neutrophils Relative % 63 43 - 77 %   Lymphocytes Relative 31 12 - 46 %   Monocytes Relative 4 3 - 12 %   Eosinophils Relative 1 0 - 5 %   Basophils Relative 1 0 - 1 %   Neutro Abs 8.9 (H) 1.7 - 7.7 K/uL   Lymphs Abs 4.4 (H) 0.7 - 4.0 K/uL   Monocytes Absolute 0.6 0.1 - 1.0 K/uL   Eosinophils Absolute 0.1 0.0 - 0.7 K/uL   Basophils Absolute 0.1 0.0 - 0.1 K/uL   RBC Morphology POLYCHROMASIA PRESENT     Comment: RARE NRBCs   WBC Morphology MILD LEFT SHIFT (1-5% METAS, OCC MYELO, OCC BANDS)     Comment: ATYPICAL LYMPHOCYTES    Dg Chest Port 1 View  02/02/2015   CLINICAL DATA:  Endotracheal tube.  CPR in progress.  EXAM: PORTABLE CHEST - 1 VIEW  COMPARISON:  02/06/2015  FINDINGS: Endotracheal tube tip between the clavicular heads and carina. Gastric suction tube enters the stomach at least.  Stable cardiopericardial enlargement. There is subcutaneous gas in the left chest wall. Small left-sided basilar pneumothorax. Suspect left rib fractures, presumably from CPR.  The right lung is low volume but otherwise clear.  Stable cardiomegaly post CABG.  Critical Value/emergent results were called by telephone at the time of interpretation on 02/08/2015 at 5:36 pm to Dr. Brand Males , who verbally acknowledged these results.  IMPRESSION: 1. Left-sided air leak with chest wall gas and small basilar pneumothorax. 2. Probable left rib fractures. 3. Endotracheal and orogastric tubes are in good position. 4. Low lung volumes.   Electronically Signed   By: Monte Fantasia M.D.   On: 01/27/2015 17:45   Dg  Chest Portable 1 View  02/02/2015   CLINICAL DATA:  Status post CPR, hypovolemia  EXAM: PORTABLE CHEST - 1 VIEW  COMPARISON:  11/16/2009  FINDINGS: Endotracheal tube is noted 3.2 cm above  the carina. A E in external metallic artifact is noted postsurgical changes are again seen. The cardiac shadow remains enlarged. The overall inspiratory effort is poor with crowding of the vascular markings. No focal confluent infiltrate is seen.  IMPRESSION: Poor inspiratory effort.  Endotracheal tube in satisfactory position.   Electronically Signed   By: Inez Catalina M.D.   On: 02/11/2015 16:41    Review of Systems  Unable to perform ROS: acuity of condition   Blood pressure 103/60, pulse 110, temperature 94.6 F (34.8 C), temperature source Core (Comment), resp. rate 30, height 6' 3" (1.905 m), SpO2 92 %. Physical Exam  Constitutional: He appears well-developed and well-nourished. He is intubated.  HENT:  Head: Normocephalic.  Neck: Neck supple.  Cardiovascular: An irregular rhythm present. Bradycardia present.   Respiratory: He is intubated.  GI: Bowel sounds are decreased.  Obese abdomen     Assessment/Plan: 64 year old male with massive upper GI bleed, likely ulcerative bleed within the stomach.  1. The patient was taken back to the operating room for emergent exploratory laparotomy.  Addendum: Postoperatively I did speak with the patient's sons. Apparently the patient did have a GI bleed approximately 8 days ago. This was out of town. The patient underwent a GI consult and an EGD was performed. A bleeding ulcer was cauterized. Of note the patient was on Plavix. This was stopped approximately 1 week ago. The patient also has a history of hypertension, diabetes, and gout. The patient had a previous CABG.        Rosario Jacks., Armando 01/25/2015, 7:43 PM

## 2015-01-28 NOTE — H&P (Signed)
PULMONARY / CRITICAL CARE MEDICINE   Name: Gary [name] MRN: [id number] DOB: 1951/02/03  PCP No primary care provider on file. - Dr Benedetto Goad    ADMISSION DATE:  02/19/2015 CONSULTATION DATE:  02/16/2015   REFERRING MD :  Dr  Cathren Laine  CHIEF COMPLAINT:  GI bleed, cardiac arrest    HISTORY OF PRESENT ILLNESS:  Hx from ER RN, ER MD, some from friend at bedside, Dr Loreta Ave of GI  64 year old male, hx of peptuic ulcer disease nos per ER but immediate sources deny ETOH, NSAID intake. Possible  anicoagulant intake (takes 2 blod thinners per friend) Brought in with EMS providing CPR and then 40 minute CPR in ER. Source GI bleed - melena and Active UGI bleed noticed. S/p 7 -8 units PRBC in ER and needing levophed 20-24mcg/min with SBP 70s. Intubated and very acidotic and in shock. PCCM admitting 02/08/2015 and GI Dr Arty Baumgartner consulting in ER. Of note, recent endoscopy and colonosciopy nos in [place] -0 "showed ulcer" per friend.Noted: Post cPR was moving all 4s,t rying to self extubate and apparently followed a command   PAST MEDICAL HISTORY :   has no past medical history on file.  has no past surgical history on file. Prior to Admission medications   Not on File   Allergies  Allergen Reactions  . Sulfonamide Derivatives     FAMILY HISTORY:  has no family status information on file.  SOCIAL HISTORY:    REVIEW OF SYSTEMS:  11 point ROS unelicitable     VITAL SIGNS: Temp:  [93.4 F (34.1 C)] 93.4 F (34.1 C) (04/04 1616) Pulse Rate:  [0-99] 87 (04/04 1515) Resp:  [12-38] 28 (04/04 1515) SpO2:  [77 %-100 %] 100 % (04/04 1515) Arterial Line BP: (110)/(69) 110/69 mmHg (04/04 1515) FiO2 (%):  [100 %] 100 % (04/04 1438) HEMODYNAMICS:   VENTILATOR SETTINGS: Vent Mode:  [-] PRVC FiO2 (%):  [100 %] 100 % Set Rate:  [14 bmp] 14 bmp Vt Set:  [500 mL-670 mL] 670 mL PEEP:  [5 cmH20] 5 cmH20 Plateau Pressure:  [24 cmH20] 24 cmH20 INTAKE / OUTPUT:  Intake/Output Summary  (Last 24 hours) at 02/23/2015 1634 Last data filed at 01/30/2015 1618  Gross per 24 hour  Intake      0 ml  Output     10 ml  Net    -10 ml    PHYSICAL EXAMINATION: General:  Obese  Male, in bed,  Neuro: movd all 4s and trying to self extubate during sedation breakthrough HEENT:  ET Tube +, NG tube + - actively bleeding Cardiovascular:  S1S2+. No murmur.  Lungs:  Synch with vent. CTA bilaterally Abdomen:  OBse, No mass Musculoskeletal:  No cyanosis. No clubbing. No edema Skin:  intact  LABS: PULMONARY  Recent Labs Lab 02/05/2015 1453 01/31/2015 1532  PHART  --  6.750*  PCO2ART  --  53.2*  PO2ART  --  239.0*  HCO3  --  7.4*  TCO2 9 9  O2SAT  --  99.0    CBC  Recent Labs Lab 02/18/2015 1438 01/27/2015 1453  HGB 5.7* 5.4*  HCT 19.2* 16.0*  WBC 9.9  --   PLT 138*  --     COAGULATION  Recent Labs Lab 02/10/2015 1438  INR 1.78*    CARDIAC  No results for input(s): TROPONINI in the last 168 hours. No results for input(s): PROBNP in the last 168 hours.   CHEMISTRY  Recent Labs  Lab 01/31/2015 1438 02/15/2015 1453  NA 149* 147*  K 4.8 4.7  CL 114* 113*  CO2 12*  --   GLUCOSE 358* 344*  BUN 33* 37*  CREATININE 2.05* 1.80*  CALCIUM 8.7  --    CrCl cannot be calculated (Unknown ideal weight.).   LIVER  Recent Labs Lab 02/22/2015 1438  AST 652*  ALT 560*  ALKPHOS 57  BILITOT 0.3  PROT 4.1*  ALBUMIN 2.0*  INR 1.78*     INFECTIOUS  Recent Labs Lab 02/10/2015 1522  LATICACIDVEN >17.00*     ENDOCRINE CBG (last 3)  No results for input(s): GLUCAP in the last 72 hours.       IMAGING x48h No results found.       ASSESSMENT / PLAN:  PULMONARY OETT 02/23/2015 >> A:acute resp failur due to PEA arrest due to GI bleed P:   Full vent support - high Ve to compensate for acidosis from tissue anoxia VAP bundle  CARDIOVASCULAR CVL Rt femoral cordis in ER 01/27/2015  A: Circulatory shock due to GI bleed P:  VAsopressor support - MAP goal > 55  with SBP > 85 till bleed controlled and then MAP goal > 65 -   - cotninue levophed  - add neo and vasop Volume resus with bood products - see heme  RENAL A:  AT high risk for ATN due to GI bleed P:   BP/HR support  GASTROINTESTINAL A:  [place]" and currnet active UG Bleed - life threatening P:   PRotonix gtt Emergent GI consult - asked Dr Loreta AveMann to scope  Regardless of risk - shows antral bleeding without varices Stat Surgical Consult - Dr Derrell Lollingamirez in OR and wll get OR ready for patient  HEMATOLOGIC A:  HEmorrhagic anemia - sp 7 unit prbc in ER P:  8th nd 9th unit PRBC stat Platelet and FFP for consumtption reasons Hgb goal > 7gm%  INFECTIOUS A:  Might havfe aspirated P:   Check PCT Monior off abx  ENDOCRINE A:  Unclear if baseline dm   P:   Awaiting home med list  NEUROLOGIC A:  AT risk for anoxic encephalopathy but moving all 4s and trying to self extubate and possibly followed  1 command  P:   Stat rocuronium x 1 for GI scope Fent /versed gtt  RASS goal: -3  During gi blee    FAMILY  - Updates: frind at bedside updated by Dr Loreta AveMann in my presence. Son updaed by DR Loreta Avemann as well over phone  - Inter-disciplinary family meet or Palliative Care meeting due by: 02/04/15    The patient is critically ill with multiple organ systems failure and requires high complexity decision making for assessment and support, frequent evaluation and titration of therapies, application of advanced monitoring technologies and extensive interpretation of multiple databases.   Critical Care Time devoted to patient care services described in this note is  75  Minutes. This time reflects time of care of this signee Dr Kalman ShanMurali Kassey Laforest. This critical care time does not reflect procedure time, or teaching time or supervisory time of PA/NP/Med student/Med Resident etc but could involve care discussion time    Dr. Kalman ShanMurali Bryer Gottsch, M.D., [facility] Pulmonary and  Critical Care Medicine Staff Physician [facility] Pulmonary and Critical Care Pager: [phone], If no answer or between  15:00h - 7:00h: call [phone]  01/27/2015 5:13 PM

## 2015-01-28 NOTE — ED Notes (Signed)
Very faint carotid and femoral pulse noted.  Dr Charna ElizabethJyothi Mann at bedside with her staff to scope pt.  RT at bedside.

## 2015-01-28 NOTE — ED Notes (Signed)
Bair placed.

## 2015-01-28 NOTE — ED Notes (Signed)
#  2 unit FFP [id number]

## 2015-01-28 NOTE — ED Provider Notes (Signed)
CSN: [id number]     Arrival date & time 02/20/2015  1414 History   First MD Initiated Contact with Patient 01/30/2015 1440     No chief complaint on file.    (Consider location/radiation/quality/duration/timing/severity/associated sxs/prior Treatment) HPI  64 y/o male with somewhat unknown PMH.  We do know that he was reportedly recently in [facility] and hospitalized with a GI bleed and received reportedly 3u prbc during that admission.  Patient returned to [facility] and today the patient's roommate called EMS today when the patient was feeling weak.  When EMS arrived, he was reportedly very weak, lethargic.  They moved him onto the stretcher and on the way to the hospital he lost a pulse.  They began CPR, rhythm was reportedly PEA.  He was intubated on the way to the hospital and mult rounds of CPR w/o ROSC.  Patient is unable to offer any hx at this time.  No past medical history on file. No past surgical history on file. No family history on file. History  Substance Use Topics  . Smoking status: Not on file  . Smokeless tobacco: Not on file  . Alcohol Use: Not on file    Review of Systems  Unable to perform ROS: Patient unresponsive      Allergies  Sulfonamide derivatives  Home Medications   Prior to Admission medications   Medication Sig Start Date End Date Taking? Authorizing Provider  allopurinol (ZYLOPRIM) 300 MG tablet Take 300 mg by mouth daily as needed. 11/26/14   Historical Provider, MD  colchicine 0.6 MG tablet Take 0.6 mg by mouth daily. 12/31/14   Historical Provider, MD  diazepam (VALIUM) 5 MG tablet Take 5 mg by mouth 2 (two) times daily as needed. 12/26/14   Historical Provider, MD  ramipril (ALTACE) 10 MG capsule Take 10 mg by mouth daily. 12/31/14   Historical Provider, MD  simvastatin (ZOCOR) 40 MG tablet Take 40 mg by mouth daily. 01/10/15   Historical Provider, MD  traMADol (ULTRAM) 50 MG tablet Take 50 mg by mouth 3 (three) times daily as needed. 12/26/14   Historical  Provider, MD   Pulse 87  Temp(Src) 94.6 F (34.8 C) (Core (Comment))  Resp 30  Ht  (1.905 m)  SpO2 47% Physical Exam  Constitutional: He appears distressed.  HENT:  Head: Normocephalic and atraumatic.  Eyes: Pupils are equal, round, and reactive to light.  Neck: Normal range of motion. Neck supple.  Cardiovascular:  pulseless  Pulmonary/Chest: He has no wheezes.  8.0 tube being bagged  bilat breath sounds  Abdominal: There is no guarding.  Guaiac + melena  Musculoskeletal: He exhibits no edema.  Neurological:  Patient unresponsive to pain  Skin: No rash noted. He is not diaphoretic.    ED Course  CENTRAL LINE Date/Time: 02/12/2015 5:08 PM Performed by: Silas Flood Authorized by: Silas Flood Consent: The procedure was performed in an emergent situation. Required items: required blood products, implants, devices, and special equipment available Indications: vascular access Patient sedated: no Preparation: skin prepped with 2% chlorhexidine Location details: right femoral Patient position: flat Catheter type: Cordis Pre-procedure: landmarks identified Number of attempts: 1 Successful placement: yes Post-procedure: line sutured Assessment: blood return through all ports and free fluid flow Comments: Crash line.  No sterile drape used.  ARTERIAL LINE Date/Time: 01/26/2015 5:10 PM Performed by: Silas Flood Authorized by: Silas Flood Consent: The procedure was performed in an emergent situation. Preparation: Patient was prepped and draped in the usual sterile fashion. Indications:  multiple ABGs and hemodynamic monitoring Location: right radial Patient sedated: no Needle gauge: 20 Seldinger technique: Seldinger technique used Number of attempts: 1 Post-procedure: line sutured Post-procedure CMS: normal Patient tolerance: Patient tolerated the procedure well with no immediate complications Comments: Full sterile technique used   (including critical care  time) Labs Review Labs Reviewed  CBC - Abnormal; Notable for the following:    RBC 1.92 (*)    Hemoglobin 5.7 (*)    HCT 19.2 (*)    MCHC 29.7 (*)    Platelets 138 (*)    All other components within normal limits  PROTIME-INR - Abnormal; Notable for the following:    Prothrombin Time 20.9 (*)    INR 1.78 (*)    All other components within normal limits  COMPREHENSIVE METABOLIC PANEL - Abnormal; Notable for the following:    Sodium 149 (*)    Chloride 114 (*)    CO2 12 (*)    Glucose, Bld 358 (*)    BUN 33 (*)    Creatinine, Ser 2.05 (*)    Total Protein 4.1 (*)    Albumin 2.0 (*)    AST 652 (*)    ALT 560 (*)    GFR calc non Af Amer 33 (*)    GFR calc Af Amer 38 (*)    Anion gap 23 (*)    All other components within normal limits  I-STAT CHEM 8, ED - Abnormal; Notable for the following:    Sodium 147 (*)    Chloride 113 (*)    BUN 37 (*)    Creatinine, Ser 1.80 (*)    Glucose, Bld 344 (*)    Calcium, Ion 1.10 (*)    Hemoglobin 5.4 (*)    HCT 16.0 (*)    All other components within normal limits  I-STAT CG4 LACTIC ACID, ED - Abnormal; Notable for the following:    Lactic Acid, Venous >17.00 (*)    All other components within normal limits  POC OCCULT BLOOD, ED - Abnormal; Notable for the following:    Fecal Occult Bld POSITIVE (*)    All other components within normal limits  I-STAT ARTERIAL BLOOD GAS, ED - Abnormal; Notable for the following:    pH, Arterial 6.750 (*)    pCO2 arterial 53.2 (*)    pO2, Arterial 239.0 (*)    Bicarbonate 7.4 (*)    Acid-base deficit 27.0 (*)    All other components within normal limits  CULTURE, BLOOD (ROUTINE X 2)  CULTURE, BLOOD (ROUTINE X 2)  BLOOD GAS, ARTERIAL  CBC  CBC  CBC  BLOOD GAS, ARTERIAL  TROPONIN I  TROPONIN I  COMPREHENSIVE METABOLIC PANEL  MAGNESIUM  PHOSPHORUS  AMYLASE  LIPASE, BLOOD  TROPONIN I  TROPONIN I  LACTIC ACID, PLASMA  PROCALCITONIN  BRAIN NATRIURETIC PEPTIDE  CORTISOL  CBC WITH  DIFFERENTIAL/PLATELET  PROTIME-INR  APTT  BLOOD GAS, ARTERIAL  LACTIC ACID, PLASMA  CBC  BASIC METABOLIC PANEL  BLOOD GAS, ARTERIAL  MAGNESIUM  PHOSPHORUS  I-STAT TROPOININ, ED  I-STAT CHEM 8, ED  I-STAT CG4 LACTIC ACID, ED  I-STAT CG4 LACTIC ACID, ED  I-STAT CHEM 8, ED  I-STAT ARTERIAL BLOOD GAS, ED  TYPE AND SCREEN  PREPARE FRESH FROZEN PLASMA  MASSIVE TRANSFUSION PROTOCOL ORDER (BLOOD BANK NOTIFICATION)  PREPARE FRESH FROZEN PLASMA  PREPARE PLATELET PHERESIS  TYPE AND SCREEN  TYPE AND SCREEN    Imaging Review Dg Chest Portable 1 View  02/01/2015   CLINICAL DATA:  Status post CPR, hypovolemia  EXAM: PORTABLE CHEST - 1 VIEW  COMPARISON:  11/16/2009  FINDINGS: Endotracheal tube is noted 3.2 cm above the carina. A E in external metallic artifact is noted postsurgical changes are again seen. The cardiac shadow remains enlarged. The overall inspiratory effort is poor with crowding of the vascular markings. No focal confluent infiltrate is seen.  IMPRESSION: Poor inspiratory effort.  Endotracheal tube in satisfactory position.   Electronically Signed   By: Alcide Clever M.D.   On: 02/08/2015 16:41     EKG Interpretation   Date/Time:  Monday January 28 2015 14:52:13 EDT Ventricular Rate:  87 PR Interval:  109 QRS Duration: 158 QT Interval:  466 QTC Calculation: 561 R Axis:   -57 Text Interpretation:  Sinus rhythm Right bundle branch block `st elevation  inferiorly, changed from prior ?stemi/demand ischemic/severe anemia  Confirmed by Denton Lank  MD, Caryn Bee ([id number]) on 02/21/2015 3:28:20 PM      MDM   Final diagnoses:  Hemorrhagic shock  Other specified hypotension  Cardiorespiratory arrest  Metabolic acidosis  Lactic acidosis  Abnormal ECG  Acute GI bleeding  Severe anemia    64 y/o male with somewhat unknown PMH.  We do know that he was reportedly recently in [facility] and hospitalized with a GI bleed recently.  Coming in today with weakness followed by AMS and PEA  arrest.  On arrival, patient in PEA arrest.  ACLS continued.  Gross melena on rectal exam, concern for massive GI bleed as the cause of the arrest.  Will begin giving blood products.  Will begin giving fluids.  Mult epi given with PEA rhythm  Crash femoral central line placed which will need to be removed once in the ICU given full sterile precautions not observed 2/2 patient actively getting chest compressions.    After mult rounds, able to feel pulse. ekg obtained and concerning for ischemia in the inferior leads. istat hgb 5.5.  Right radial arterial line placed and bp's in the 80's/90's systolic.  Levophed started to help with maintaining pressures as there is likely some myocardial dysfunction as he is post cardiac arrest.  In total, 7 u prbc given in th eED  CXR obtained and shows good position of the tube.  GI consulted.  pulm crit consulted for micu admission.    Silas Flood, MD 02/04/2015 6045  Cathren Laine, MD 02/17/2015 [phone]

## 2015-01-28 NOTE — ED Notes (Signed)
NG tube placed removed by GI team to be able to gain scope access.

## 2015-01-28 NOTE — ED Notes (Signed)
#  1 unit FFP [id number]

## 2015-01-28 NOTE — Code Documentation (Signed)
No pulse with doppler.  Pt remains in pea.

## 2015-01-29 ENCOUNTER — Encounter (HOSPITAL_COMMUNITY): Payer: Self-pay | Admitting: Gastroenterology

## 2015-01-29 ENCOUNTER — Other Ambulatory Visit: Payer: Self-pay | Admitting: Internal Medicine

## 2015-01-29 LAB — TYPE AND SCREEN
ABO/RH(D): B POS
ANTIBODY SCREEN: NEGATIVE
DONOR AG TYPE: NEGATIVE
DONOR AG TYPE: NEGATIVE
DONOR AG TYPE: NEGATIVE
DONOR AG TYPE: NEGATIVE
DONOR AG TYPE: NEGATIVE
DONOR AG TYPE: NEGATIVE
DONOR AG TYPE: NEGATIVE
DONOR AG TYPE: NEGATIVE
DONOR AG TYPE: NEGATIVE
DONOR AG TYPE: NEGATIVE
DONOR AG TYPE: NEGATIVE
DONOR AG TYPE: NEGATIVE
DONOR AG TYPE: NEGATIVE
DONOR AG TYPE: NEGATIVE
DONOR AG TYPE: NEGATIVE
DONOR AG TYPE: NEGATIVE
DONOR AG TYPE: POSITIVE
DONOR AG TYPE: POSITIVE
DONOR AG TYPE: POSITIVE
Donor AG Type: NEGATIVE
Donor AG Type: NEGATIVE
Donor AG Type: NEGATIVE
Donor AG Type: NEGATIVE
Donor AG Type: NEGATIVE
Donor AG Type: NEGATIVE
Donor AG Type: NEGATIVE
Donor AG Type: NEGATIVE
Donor AG Type: NEGATIVE
Donor AG Type: NEGATIVE
Donor AG Type: NEGATIVE
Donor AG Type: POSITIVE
Donor AG Type: POSITIVE
UNIT DIVISION: 0
UNIT DIVISION: 0
UNIT DIVISION: 0
UNIT DIVISION: 0
UNIT DIVISION: 0
UNIT DIVISION: 0
UNIT DIVISION: 0
UNIT DIVISION: 0
UNIT DIVISION: 0
UNIT DIVISION: 0
UNIT DIVISION: 0
UNIT DIVISION: 0
UNIT DIVISION: 0
UNIT DIVISION: 0
UNIT DIVISION: 0
UNIT DIVISION: 0
UNIT DIVISION: 0
UNIT DIVISION: 0
UNIT DIVISION: 0
UNIT DIVISION: 0
UNIT DIVISION: 0
UNIT DIVISION: 0
UNIT DIVISION: 0
Unit division: 0
Unit division: 0
Unit division: 0
Unit division: 0
Unit division: 0
Unit division: 0
Unit division: 0
Unit division: 0
Unit division: 0
Unit division: 0
Unit division: 0
Unit division: 0
Unit division: 0
Unit division: 0
Unit division: 0
Unit division: 0
Unit division: 0
Unit division: 0
Unit division: 0
Unit division: 0
Unit division: 0
Unit division: 0
Unit division: 0

## 2015-01-29 LAB — PREPARE FRESH FROZEN PLASMA
UNIT DIVISION: 0
UNIT DIVISION: 0
UNIT DIVISION: 0
UNIT DIVISION: 0
UNIT DIVISION: 0
UNIT DIVISION: 0
UNIT DIVISION: 0
UNIT DIVISION: 0
UNIT DIVISION: 0
UNIT DIVISION: 0
UNIT DIVISION: 0
UNIT DIVISION: 0
UNIT DIVISION: 0
UNIT DIVISION: 0
UNIT DIVISION: 0
UNIT DIVISION: 0
Unit division: 0
Unit division: 0
Unit division: 0
Unit division: 0
Unit division: 0
Unit division: 0
Unit division: 0
Unit division: 0
Unit division: 0
Unit division: 0
Unit division: 0
Unit division: 0
Unit division: 0
Unit division: 0
Unit division: 0

## 2015-01-29 LAB — PREPARE PLATELET PHERESIS
UNIT DIVISION: 0
Unit division: 0
Unit division: 0

## 2015-01-30 LAB — POCT I-STAT 7, (LYTES, BLD GAS, ICA,H+H)
ACID-BASE DEFICIT: 13 mmol/L — AB (ref 0.0–2.0)
Acid-base deficit: 15 mmol/L — ABNORMAL HIGH (ref 0.0–2.0)
Bicarbonate: 16.5 mEq/L — ABNORMAL LOW (ref 20.0–24.0)
Bicarbonate: 15.1 mEq/L — ABNORMAL LOW (ref 20.0–24.0)
CALCIUM ION: 0.82 mmol/L — AB (ref 1.13–1.30)
CALCIUM ION: 0.75 mmol/L — AB (ref 1.13–1.30)
HCT: 24 % — ABNORMAL LOW (ref 39.0–52.0)
HCT: 26 % — ABNORMAL LOW (ref 39.0–52.0)
Hemoglobin: 8.2 g/dL — ABNORMAL LOW (ref 13.0–17.0)
Hemoglobin: 8.8 g/dL — ABNORMAL LOW (ref 13.0–17.0)
O2 SAT: 98 %
O2 Saturation: 98 %
PH ART: 7.069 — AB (ref 7.350–7.450)
PO2 ART: 151 mmHg — AB (ref 80.0–100.0)
POTASSIUM: 5.6 mmol/L — AB (ref 3.5–5.1)
POTASSIUM: 6 mmol/L — AB (ref 3.5–5.1)
Patient temperature: 35
Patient temperature: 33.5
Sodium: 148 mmol/L — ABNORMAL HIGH (ref 135–145)
Sodium: 147 mmol/L — ABNORMAL HIGH (ref 135–145)
TCO2: 18 mmol/L (ref 0–100)
TCO2: 17 mmol/L (ref 0–100)
pCO2 arterial: 54.7 mmHg — ABNORMAL HIGH (ref 35.0–45.0)
pCO2 arterial: 49.5 mmHg — ABNORMAL HIGH (ref 35.0–45.0)
pH, Arterial: 7.074 — CL (ref 7.350–7.450)
pO2, Arterial: 137 mmHg — ABNORMAL HIGH (ref 80.0–100.0)

## 2015-01-30 LAB — POCT I-STAT GLUCOSE
GLUCOSE: 360 mg/dL — AB (ref 70–99)
Operator id: 320841

## 2015-01-31 ENCOUNTER — Telehealth: Payer: Self-pay

## 2015-01-31 NOTE — Discharge Summary (Signed)
DISCHARGE SUMMARY    Date of admit: 02/18/2015  2:14 PM Date of discharge: 02/03/2015  1:49 AM Length of Stay: 1 days  PCP is No primary care provider on file.   PROBLEM LIST Active Problems:   Gastrointestinal hemorrhage /  Hemorrhagic shock /   Shock circulatory due to Gastric Ulcer   Lactic acidosis   Acute respiratory failure   Cardiac arrest     SUMMARY Gary [name] was 64 y.o. patient with    has no past medical history on file.   has past surgical history that includes Esophagogastroduodenoscopy (N/A, 02/12/2015) and laparotomy (N/A, 02/19/2015).   Admitted on 02/19/2015 with PEA cardiac arrest on EMS pick up with subsequent ongoing CPR in ER. Etiology upper GI bleed. Post ROSC after prolonged CPR (approx 80min) he was noticed to be possibly following commands. Urgent GI consult scope showed active hemorrhage upper gi tract without varices. A hx of peptic ulcer disease "recently" was obtained by his friend at bedside. PAtient taken emergently to surgery showing gastric ulcer and s/p gastrotomy, and oversewing of gastric ulcer and placement of abd vac but ppatient despite many units of blood and FFP/platelets was very coagulopathic and after discussion with family was made DNR and he expired 02/23/2015  Dr. Kalman ShanMurali Zyrah Wiswell, M.D., [facility].C.C.P Pulmonary and Critical Care Medicine Staff Physician [facility] [place] Pulmonary and Critical Care Pager: [phone], If no answer or between  15:00h - 7:00h: call [phone]  01/31/2015 9:55 PM                    SIGNED Dr. Kalman ShanMurali Leocadio Heal, M.D., F.C.C.P Pulmonary and Critical Care Medicine Staff Physician [facility] [place] Pulmonary and Critical Care Pager: [phone], If no answer or between  15:00h - 7:00h: call [phone]  01/31/2015 9:50 PM

## 2015-01-31 NOTE — Telephone Encounter (Signed)
Received faxed cremation death certificate 9/8/114/7/16 from [facility].  Will be for Dr. Sung AmabileSimonds.  Paged and will take to hospital per his request.  Received back and faxed to [facility].  Awaiting original.  Received original 02/01/15.  Sending to 2100 for Dr. Sung AmabileSimonds.  Received back 02/05/15.  [facility]/06/[facility].

## 2015-02-24 DEATH — deceased
